# Patient Record
Sex: Male | Born: 1990 | Race: White | Hispanic: No | Marital: Single | Smoking: Current every day smoker
Health system: Southern US, Community
[De-identification: ages and names within clinical notes are randomized; demographics above are authoritative.]

## PROBLEM LIST (undated history)

## (undated) DIAGNOSIS — I10 Essential (primary) hypertension: Secondary | ICD-10-CM

## (undated) HISTORY — PX: OTHER SURGICAL HISTORY: SHX169

---
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
## 2010-08-23 LAB — URINALYSIS, ROUTINE W REFLEX MICROSCOPIC
Bilirubin Urine: NEGATIVE
Glucose, UA: NEGATIVE mg/dL
Hgb urine dipstick: NEGATIVE
Ketones, ur: NEGATIVE mg/dL
Protein, ur: NEGATIVE mg/dL
Specific Gravity, Urine: >1.03 — ABNORMAL HIGH (ref 1.005–1.030)

## 2010-08-23 LAB — DIFFERENTIAL
Basophils Absolute: 0 10*3/uL (ref 0.0–0.1)
Basophils Relative: 0 % (ref 0–1)
Eosinophils Absolute: 0 10*3/uL (ref 0.0–0.7)
Lymphocytes Relative: 14 % (ref 12–46)
Lymphs Abs: 1.4 10*3/uL (ref 0.7–4.0)
Monocytes Absolute: 0.7 10*3/uL (ref 0.1–1.0)
Monocytes Relative: 7 % (ref 3–12)

## 2010-08-23 LAB — CBC
RBC: 5.27 MIL/uL (ref 4.22–5.81)
RDW: 12.8 % (ref 11.5–15.5)
WBC: 9.8 10*3/uL (ref 4.0–10.5)

## 2010-08-23 LAB — BASIC METABOLIC PANEL
BUN: 10 mg/dL (ref 6–23)
CO2: 27 mEq/L (ref 19–32)
Calcium: 9.4 mg/dL (ref 8.4–10.5)
Chloride: 103 mEq/L (ref 96–112)
Creatinine, Ser: 1.04 mg/dL (ref 0.4–1.5)
GFR calc Af Amer: >60 mL/min (ref >60)
GFR calc non Af Amer: >60 mL/min (ref >60)
Glucose, Bld: 96 mg/dL (ref 70–99)
Potassium: 3.8 mEq/L (ref 3.5–5.1)
Sodium: 140 mEq/L (ref 135–145)

## 2010-09-23 NOTE — Op Note (Signed)
NAMEKAMDEN, STANISLAW NO.:  [id number]   MEDICAL RECORD NO.:  [id number]                   PATIENT TYPE:  INP   LOCATION:  6148                                 FACILITY:  [facility]   PHYSICIAN:  Ines Bloomer, M.D.              DATE OF BIRTH:  Mar 06, 1991   DATE OF PROCEDURE:  10/09/2003  DATE OF DISCHARGE:                                 OPERATIVE REPORT   PREOPERATIVE DIAGNOSIS:  Pectus carinatum.   POSTOPERATIVE DIAGNOSIS:  Pectus carinatum.   OPERATION PERFORMED:  Repair of pectus carinatum.   SURGEON:  Ines Bloomer, M.D.   FIRST ASSISTANT:  Coral Ceo, P.A.   General anesthesia.   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   After the patient was prepped and draped in the usual sterile manner and  placed in the supine position, a transverse incision was made over the fifth  intercostal space, crossing the midline with it evenly symmetrical on both  the right and left side of the midline.  The skin flaps were then developed  superiorly and inferiorly.  Superiorly they were developed up to the angle  of Louis or the second intercostal space cartilage, and inferiorly they were  developed 4-5 cm down to the rectus muscle and laterally developing the  rectus muscle and serratus muscle.  The pectoralis major muscle was divided  in the midline off the sternum and dissected up medially and laterally,  developing flaps to expose the costal cartilages from two down to the costal  margin bilaterally.  Inferiorly the xiphoid was detached from the rectus  muscle and the rectus muscle was also detached from the costal margin with  electrocautery and attention was started on the left side, and a  subperichondrial resection was done, stripping the anterior portion of the  perichondrium with electrocautery, then using jokers and small periosteal  elevators to strip the  perichondrium off the cartilage from the insertion  into the sternum out to the costochondral junction.  After this had been  dissected out first starting with the fourth costal cartilage, it was  resected and removed to allow for regeneration of the cartilage.  In a like  manner, the third was done and then the fourth and the fifth, sixth, and  seventh costal cartilages were removed.  The fifth, sixth, and seventh  costal cartilages were markedly deformed as the pectus carinatum was more  elevated to the left than to the right.  After these had been resected  subperiosteally, the third and the fourth back to the costochondral junction  and the third, fourth, and fifth both to the costochondral junction, but the  sixth and the seventh were dissected back to the anterior axillary line and  then removed.  After all of this had been done on the left side, all  bleeders were electrocoagulated and attention was  turned to the right side,  and in like manner the fourth costal cartilage was resected subperiosteally,  again using periosteal elevators to free up the perichondrium and then  dividing the cartilage and dissecting it out proximally and distally and  removing it at the sternochondral junction, all cartilage at the  sternochondral junction.  The third, fourth, and fifth costal cartilages  were removed in this manner.  The sixth and seventh cartilages, which were  not as deformed as on the left side, were also removed back to the anterior  axillary line subperichondrially.  After this had been done, all bleeders  were again electrocoagulated and then the sternum was detached from the  intercostal muscle bundles with electrocautery and freed up posteriorly from  the areolar tissue posterior to the sternum.  The sternum was completely  freed up all the way up to the second costal cartilage, which was right at  the angle of Louis.  After this had been done, two double osteotomies were  done,  one right at the angle of Louis and one about 3 cm distal down the  angle of Louis.  These were anterior osteotomies down to the posterior  cortex that allowed the sternum to be straightened out from its previous  position, elevated position.  The sternum was straightened and the  intercostal muscle bundles were reattached to the sternum with interrupted 2-  0 Vicryl.  The xiphoid had been removed but to the distal end of the  sternum, the rectus muscle was reattached with 2-0 Vicryl.  Then the  pectoralis muscle was closed with 2-0 Vicryl over the sternum and the  pectoralis and the rectus muscle was reattached inferiorly to the pectoralis  muscle.  Underneath this muscle layer a Blake drain was placed on the right  side and sutured in place with 3-0 silk.  Then above the muscle layer I left  a Blake drain, a #10 Blake drain was placed in the left side and sutured in  place with 2-0 silk.  They were brought in through separate stab wounds.  Then the skin flap was closed with interrupted 2-0 Vicryl to tack the flaps  down to the muscle layer underneath and the skin was closed with, the  subcutaneous tissue was closed with 3-0 Vicryl and the skin with 4-0  Monocryl, Dermabond also for the skin.  A dry and sterile dressing applied  and the patient was returned to the recovery room in stable condition.                                               Ines Bloomer, M.D.    DPB/MEDQ  D:  10/09/2003  T:  10/10/2003  Job:  [id number]

## 2010-09-23 NOTE — H&P (Signed)
Stephen Villa, TOWELL NO.:  [id number]   MEDICAL RECORD NO.:  [id number]                   PATIENT TYPE:  INP   LOCATION:  NA                                   FACILITY:  [facility]   PHYSICIAN:  Ines Bloomer, M.D.              DATE OF BIRTH:  09-07-1990   DATE OF ADMISSION:  10/09/2003  DATE OF DISCHARGE:                                HISTORY & PHYSICAL   REFERRING PHYSICIAN:  Dr. Laverle Hobby of [facility] in [place].   CHIEF COMPLAINT:  Chest wall deformity first noticed approximately 1 year  ago.   HISTORY OF PRESENT ILLNESS:  Stephen Villa is a 70 year old Caucasian  male who was referred to Dr. Ines Bloomer by Dr. Dr. Wende Crease for  evaluation for a painful protrusion in his left lower sternum that has  increased in nature as he has been increasing in size.  A chest CT was done  showing a pectus carinatum configuration of the chest.  No masses were seen  and the skin was otherwise normal.  Since his chest protrusion was tender at  times and quite noticeable, he was referred to Dr. Edwyna Shell for possible  repair.  He was seen by Dr. Edwyna Shell in late December of 2004.  At that time,  Dr. Edwyna Shell recommended surgical repair.  The family has elected to schedule  the repair once the patient was on summer vacation from school.  Since  December, [name] says his chest protrusion has grown in size.  It does not  cause him any respiratory problems.  He also denies chest pain.  The  protrusion is not necessarily tender to touch but is painful at times,  particularly when playing sports.  Of note, [name] was recently treated for  chickenpox by his primary physician.  He was first diagnosed on Sep 28, 2003  and is status post 1-week treatment of Valtrex and hydroxyzine as needed.  The patient says he is recovering well.  He is afebrile and his lesions are  now drying and beginning to resolve.   PAST MEDICAL HISTORY:  Recently treated for varicella  zoster.  Otherwise,  his past medical history is essentially negative other than a short  hospitalization as an infant for a gastrointestinal illness.   PAST SURGICAL HISTORY:  He has had no prior surgeries.   ALLERGIES:  He has no known drug allergies.   MEDICATIONS:  He is currently on no medications.   SOCIAL HISTORY:  Stephen Villa will be going into the eighth grade this fall.  He  lives with his mom and sister in [place].  He denies any use of alcohol  or tobacco products.   FAMILY HISTORY:  Family history noncontributory.  Negative for hypertension,  cancer, coronary artery disease, vascular disease or chest abnormalities.  His grandmother does have diabetes mellitus.   REVIEW OF SYSTEMS:  Please refer to history of present illness for pertinent  positive and negatives.  In addition, he reports no changes in his weight,  no lower extremity edema, and no fever.   PHYSICAL EXAM:  VITAL SIGNS:  His blood pressure is 126/80, heart rate 80,  respirations 20.  GENERAL APPEARANCE:  This is a 20 year old Caucasian male who is alert,  cooperative, in no acute distress.  He does have multiple dried  maculopapular lesions scattered over his body, particularly noted on his  trunk, face and neck.  There is no drainage or acute erythema noted.  These  lesions do appear consistent with resolving varicella zoster.  HEENT:  His head is normocephalic and atraumatic.  His pupils are equal and  his sclerae are nonicteric.  His oral mucous membranes are pink and moist.  He has good dentition with no loose teeth noted.  No erythema or lesions are  noted.  NECK:  His neck is supple.  No thyromegaly, lymphadenopathy or jugular  venous distention is noted.  RESPIRATORY:  His breathing is unlabored and his breath sounds are clear  throughout.  No wheezes, rhonchi or rales are noted.  He does have a  significant protrusion of his lower sternum with elevation of the sternum to  the left.  No  excoriations or erosions are noted.  His chest protrusion is  nontender and is without erythema.  CARDIAC:  His heart has a regular rate and rhythm.  No murmur, rub or gallop  are noted.  ABDOMEN:  His abdomen is soft, nontender and nondistended.  He does have  normoactive bowel sounds.  No hepatosplenomegaly or masses are noted.  GU/RECTAL:  These exams were deferred.  EXTREMITIES:  His extremities are warm.  They are without edema.  He has  palpable distal pulses.  NEUROLOGIC:  His neurologic exam is grossly intact.  He is alert and  oriented x4.  His gait is steady and his speech is clear.  His muscle  strength in his bilateral upper and lower extremities is 5/5.   IMPRESSION:  1. Pectus carinatum.  2. Recovering from recent varicella zoster, status post 1-week treatment     with Valtrex.   PLAN:  I have discussed Mr. Monahan's recent varicella zoster infection  with Dr. Edwyna Shell.  Dr. Edwyna Shell felt that since [name] is status post treatment  with Valtrex and the fact that his lesions are now drying and starting to  clear, that it would be okay to proceed with repair of his pectus carinatum  as planned on October 09, 2003 at [facility].  Surgery will be  performed by Dr. Edwyna Shell, who has seen and examined the patient prior to his  hospitalization and has explained the risks, benefits and alternatives with  both the patient and his mother.  The patient, with the consent of his  mother, has agreed to proceed.      [name], P.A.                  Ines Bloomer, M.D.    AWZ/MEDQ  D:  10/07/2003  T:  10/07/2003  Job:  [id number]   cc:   Ines Bloomer, M.D.  [address]  [place]  [place] [address]   Laverle Hobby, M.D.

## 2010-09-23 NOTE — Discharge Summary (Signed)
Stephen Villa, Stephen Villa NO.:  [id number]   MEDICAL RECORD NO.:  [id number]                   PATIENT TYPE:  INP   LOCATION:  6148                                 FACILITY:  [facility]   PHYSICIAN:  Ines Bloomer, M.D.              DATE OF BIRTH:  12/23/1990   DATE OF ADMISSION:  10/09/2003  DATE OF DISCHARGE:  10/13/2003                                 DISCHARGE SUMMARY   HISTORY OF PRESENT ILLNESS:  This is a 20 year old male who was admitted in  referral from Dr. Wende Crease at Urgent Care in [place] due to painful  protrusion of his left lower sternum that has increased in nature as he has  increased in size.  Chest CT revealed a pectus carinatum configuration of  the chest and no mass was seen, and the scan was otherwise normal.  Since  the protrusion was felt to be tender and quite noticeable, he was referred  to Dr. Edwyna Shell for possible repair.  Dr. Edwyna Shell evaluated the patient and  recommended surgical repair, and he was admitted as hospitalization for the  procedure.   PAST MEDICAL HISTORY:  No significant.  He did have recent chicken pox.   ALLERGIES:  No known drug allergies.   MEDICATIONS ON ADMISSION:  None.   PAST SURGICAL HISTORY:  None.   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  He lives with his mom and one sister in [place],  [place].   FAMILY HISTORY:  Noncontributory.   REVIEW OF SYSTEMS:  Unremarkable.   PHYSICAL EXAMINATION:  Please see the history and physical done at the time  of admission.   HOSPITAL COURSE:  The patient was admitted electively and on 10/09/2003 was  taken to the operating room where he underwent a repair of pectus carinatum  by Dr. Edwyna Shell.  The patient tolerated the procedure well and was taken to  the post anesthesia care unit in stable condition.   POSTOPERATIVE HOSPITAL COURSE:  Hospital course has been unremarkable.  Laboratory values are  stable.  He did have a slight initial bump in his AST  and ALT but they have returned to normal.  Hemoglobin and hematocrit on  postoperative day number 1 were stable at 14.2 and 40.8 respectively.  His  routine lines, monitors and all drainage devices have been discontinued in  the standard fashion without difficulty.  His incision is healing well.  He  has tolerated a gradual increase in diet without difficulty.  He has  tolerated a gradual increase in activity, commensurate for level of  postoperative convalescence.  Overall, he is felt to be tentatively stable  for discharge in the morning of October 13, 2003 pending morning round re-  evaluation.   FINAL DIAGNOSIS:  Pectus carinatum.   INSTRUCTIONS:  The patient received written instructions in regard to  medications, activity, diet,  wound care and followup.  Medications for pain  will be Tylox 1 every 4 to 6 hours as needed.  Followup will be Dr. Edwyna Shell  on June 14 at 4:15 p.m. with a chest x-ray for [facility].      Rowe Clack, P.A.-C.                    Ines Bloomer, M.D.    Sherryll Burger  D:  10/12/2003  T:  10/13/2003  Job:  [id number]   cc:   Ines Bloomer, M.D.  [address]  [place]  [place] [address]

## 2011-02-16 LAB — DIFFERENTIAL
Eosinophils Relative: 2
Lymphocytes Relative: 32
Lymphs Abs: 1.6
Monocytes Absolute: 0.5
Monocytes Relative: 9

## 2011-02-16 LAB — BASIC METABOLIC PANEL
CO2: 29
Calcium: 9.2
Sodium: 139

## 2011-02-16 LAB — CBC
HCT: 45.5
Hemoglobin: 15.7
MCV: 88.8
RBC: 5.12
WBC: 5

## 2011-02-16 LAB — TROPONIN I: Troponin I: 0.03

## 2011-02-16 LAB — CK TOTAL AND CKMB (NOT AT ARMC): CK, MB: 3.5

## 2012-01-07 ENCOUNTER — Emergency Department (HOSPITAL_COMMUNITY)
Admission: EM | Admit: 2012-01-07 | Discharge: 2012-01-08 | Disposition: A | Discharge status: Home / Self Care | Payer: Self-pay | Attending: Emergency Medicine | Admitting: Emergency Medicine

## 2012-01-07 ENCOUNTER — Encounter (HOSPITAL_COMMUNITY): Payer: Self-pay | Admitting: Emergency Medicine

## 2012-01-07 DIAGNOSIS — F17200 Nicotine dependence, unspecified, uncomplicated: Secondary | ICD-10-CM | POA: Insufficient documentation

## 2012-01-07 DIAGNOSIS — I10 Essential (primary) hypertension: Secondary | ICD-10-CM | POA: Insufficient documentation

## 2012-01-07 DIAGNOSIS — R55 Syncope and collapse: Secondary | ICD-10-CM

## 2012-01-07 DIAGNOSIS — R040 Epistaxis: Secondary | ICD-10-CM

## 2012-01-07 LAB — CBC WITH DIFFERENTIAL/PLATELET
Hemoglobin: 15.6 g/dL (ref 13.0–17.0)
Lymphocytes Relative: 28 % (ref 12–46)
MCV: 91.7 fL (ref 78.0–100.0)
Monocytes Absolute: 0.7 10*3/uL (ref 0.1–1.0)
Monocytes Relative: 8 % (ref 3–12)
Neutrophils Relative %: 62 % (ref 43–77)

## 2012-01-07 MED ORDER — SODIUM CHLORIDE 0.9 % IV SOLN
Freq: Once | INTRAVENOUS | Status: AC
  Administered 2012-01-07: via INTRAVENOUS

## 2012-01-07 NOTE — ED Notes (Addendum)
Per EMS - patient has been outside all day and not drinking any liquids. States he had a syncopal episode and nose bleed. Also complaining of hypertension 160/100. No bleeding noted at triage. Patient alert and oriented at triage.

## 2012-01-07 NOTE — ED Provider Notes (Signed)
History     CSN: [id number]  Arrival date & time 01/07/12  2145   First MD Initiated Contact with Patient 01/07/12 2309      Chief Complaint  Patient presents with  . Heat Exposure  . Loss of Consciousness  . Hypertension    (Consider location/radiation/quality/duration/timing/severity/associated sxs/prior treatment) HPI Comments: Pt states he has been working at a [employer] for the past 4 days and standing frequently in the direct sun.  He has not been overly exertional and has been drinking plenty of fluids.  When he got home this PM he "didn't feel good".  He went into the bathroom and was washing his hands and noticed his L nostril was bleeding and reportedly passed out although he denies it was seeing blood.  Family state that he did not respond to them calling his name ~ 10 min.  He was brought to the ED via EMS.  They started an IV and was given 500 ml of NS.  Feels "normal" at exam time.  Patient is a 21 y.o. male presenting with syncope and hypertension. The history is provided by the patient. No language interpreter was used.  Loss of Consciousness Episode onset: severa hours ago. The problem has been resolved. Pertinent negatives include no chills, fever, nausea or vomiting. Nothing aggravates the symptoms. He has tried nothing for the symptoms.  Hypertension Pertinent negatives include no chills, fever, nausea or vomiting.    History reviewed. No pertinent past medical history.  Past Surgical History  Procedure Date  . Chest cartilage surgery     History reviewed. No pertinent family history.  History  Substance Use Topics  . Smoking status: Current Everyday Smoker  . Smokeless tobacco: Not on file  . Alcohol Use: No      Review of Systems  Constitutional: Negative for fever and chills.  HENT: Positive for nosebleeds.   Cardiovascular: Positive for syncope.  Gastrointestinal: Negative for nausea and vomiting.  Neurological: Positive for syncope.  All  other systems reviewed and are negative.    Allergies  Atenolol  Home Medications  No current outpatient prescriptions on file.  BP 138/74  Pulse 105  Temp 98.3 F (36.8 C) (Oral)  Resp 16  Ht 6\' 1"  (1.854 m)  Wt 220 lb (99.791 kg)  BMI 29.03 kg/m2  SpO2 98%  Physical Exam  Nursing note and vitals reviewed. Constitutional: He is oriented to person, place, and time. He appears well-developed and well-nourished.  HENT:  Head: Normocephalic and atraumatic.  Nose: Nose normal.       No active bleeding from nose   Eyes: EOM are normal.  Neck: Normal range of motion.  Cardiovascular: Normal rate, regular rhythm, normal heart sounds and intact distal pulses.   Pulmonary/Chest: Effort normal and breath sounds normal. No respiratory distress.  Abdominal: Soft. He exhibits no distension. There is no tenderness.  Musculoskeletal: Normal range of motion.  Neurological: He is alert and oriented to person, place, and time.  Skin: Skin is warm and dry.  Psychiatric: He has a normal mood and affect. Judgment normal.    ED Course  Procedures (including critical care time)   Labs Reviewed  BASIC METABOLIC PANEL  URINALYSIS, ROUTINE W REFLEX MICROSCOPIC  CBC WITH DIFFERENTIAL   No results found.   No diagnosis found.  0030-RN called me and states the pt's labs are back.  The pt has rec'd 500 ml of the 1000 ml ordered but he feels better and wants to go home.  MDM  Return as needed. Drink plenty of fluids.         Evalina Field, [place] 01/08/12 [phone]

## 2012-01-08 LAB — URINALYSIS, ROUTINE W REFLEX MICROSCOPIC
Glucose, UA: NEGATIVE mg/dL
Hgb urine dipstick: NEGATIVE
Ketones, ur: NEGATIVE mg/dL
Nitrite: NEGATIVE
Protein, ur: NEGATIVE mg/dL
Urobilinogen, UA: 0.2 mg/dL (ref 0.0–1.0)

## 2012-01-08 LAB — BASIC METABOLIC PANEL
BUN: 12 mg/dL (ref 6–23)
Creatinine, Ser: 1.26 mg/dL (ref 0.50–1.35)
GFR calc Af Amer: >90 mL/min (ref >90)
Potassium: 4 mEq/L (ref 3.5–5.1)

## 2012-01-08 NOTE — ED Provider Notes (Signed)
Medical screening examination/treatment/procedure(s) were performed by non-physician practitioner and as supervising physician I was immediately available for consultation/collaboration.    Vida Roller, MD 01/08/12 [phone]

## 2012-01-08 NOTE — ED Notes (Signed)
Patient requesting to leave. States he feels better and does not feel like he needs the rest of the IV fluid. Advised Al Decant PA.

## 2012-01-27 ENCOUNTER — Emergency Department (HOSPITAL_COMMUNITY)
Admission: EM | Admit: 2012-01-27 | Discharge: 2012-01-27 | Disposition: A | Discharge status: Home / Self Care | Payer: Self-pay | Attending: Emergency Medicine | Admitting: Emergency Medicine

## 2012-01-27 ENCOUNTER — Encounter (HOSPITAL_COMMUNITY): Payer: Self-pay | Admitting: *Deleted

## 2012-01-27 DIAGNOSIS — F17200 Nicotine dependence, unspecified, uncomplicated: Secondary | ICD-10-CM | POA: Insufficient documentation

## 2012-01-27 DIAGNOSIS — L731 Pseudofolliculitis barbae: Secondary | ICD-10-CM

## 2012-01-27 DIAGNOSIS — L738 Other specified follicular disorders: Secondary | ICD-10-CM | POA: Insufficient documentation

## 2012-01-27 DIAGNOSIS — Z888 Allergy status to other drugs, medicaments and biological substances status: Secondary | ICD-10-CM | POA: Insufficient documentation

## 2012-01-27 MED ORDER — SULFAMETHOXAZOLE-TRIMETHOPRIM 800-160 MG PO TABS: 1.0000 | ORAL_TABLET | Freq: Two times a day (BID) | ORAL | Status: DC

## 2012-01-27 NOTE — ED Notes (Signed)
Pt c/o lump to left testicle area for several months, denies any drainage.

## 2012-01-27 NOTE — ED Provider Notes (Signed)
History     CSN: [id number]  Arrival date & time 01/27/12  1807   First MD Initiated Contact with Patient 01/27/12 1816      Chief Complaint  Patient presents with  . Mass    (Consider location/radiation/quality/duration/timing/severity/associated sxs/prior treatment) HPI Comments: The patient presents with a two month history of a swollen area to the left groin.  It is not painful unless he squeezes it or attempts to drain it with a pin.  There is no trouble with urination.  No fevers or chills.  He read on internet about various things it could be a got scared.    The history is provided by the patient.    History reviewed. No pertinent past medical history.  Past Surgical History  Procedure Date  . Chest cartilage surgery     No family history on file.  History  Substance Use Topics  . Smoking status: Current Every Day Smoker  . Smokeless tobacco: Not on file  . Alcohol Use: No      Review of Systems  All other systems reviewed and are negative.    Allergies  Atenolol  Home Medications  No current outpatient prescriptions on file.  BP 138/84  Pulse 90  Temp 98.2 F (36.8 C)  Resp 20  Ht 6\' 1"  (1.854 m)  Wt 220 lb (99.791 kg)  BMI 29.03 kg/m2  SpO2 98%  Physical Exam  Nursing note and vitals reviewed. Constitutional: He is oriented to person, place, and time. He appears well-developed and well-nourished. No distress.  HENT:  Head: Normocephalic and atraumatic.  Neck: Normal range of motion. Neck supple.  Musculoskeletal: Normal range of motion.  Neurological: He is alert and oriented to person, place, and time.  Skin: He is not diaphoretic.       In the left groin there is a small pea-sized lump on the skin that appears to be a hair follicle.  There is no fluctuance and no surrounding erythema.  There is no drainage.    ED Course  Procedures (including critical care time)  Labs Reviewed - No data to display No results found.   No  diagnosis found.    MDM  Does not seem to be an abscess.  At this point I will not attempt to I and D.  Will treat with soaks, antibiotics.  Return prn.        Geoffery Lyons, MD 01/27/12 [phone]

## 2012-02-01 ENCOUNTER — Emergency Department (HOSPITAL_COMMUNITY): Payer: Self-pay

## 2012-02-01 ENCOUNTER — Encounter (HOSPITAL_COMMUNITY): Payer: Self-pay

## 2012-02-01 ENCOUNTER — Emergency Department (HOSPITAL_COMMUNITY)
Admission: EM | Admit: 2012-02-01 | Discharge: 2012-02-01 | Disposition: A | Discharge status: Home / Self Care | Payer: Self-pay | Attending: Emergency Medicine | Admitting: Emergency Medicine

## 2012-02-01 DIAGNOSIS — S29011A Strain of muscle and tendon of front wall of thorax, initial encounter: Secondary | ICD-10-CM

## 2012-02-01 DIAGNOSIS — X500XXA Overexertion from strenuous movement or load, initial encounter: Secondary | ICD-10-CM | POA: Insufficient documentation

## 2012-02-01 DIAGNOSIS — IMO0002 Reserved for concepts with insufficient information to code with codable children: Secondary | ICD-10-CM | POA: Insufficient documentation

## 2012-02-01 DIAGNOSIS — F17200 Nicotine dependence, unspecified, uncomplicated: Secondary | ICD-10-CM | POA: Insufficient documentation

## 2012-02-01 NOTE — ED Notes (Signed)
Patient with no complaints at this time. Respirations even and unlabored. Skin warm/dry. Discharge instructions reviewed with patient at this time. Patient given opportunity to voice concerns/ask questions.e. Patient discharged at this time and left Emergency Department with steady gait.   

## 2012-02-01 NOTE — ED Notes (Signed)
Was lifting on Tuesday afternoon and felt something pull in my chest. Had a prior surgery  In 2006 on my sternum and that is where it hurts per pt.
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
## 2012-02-03 NOTE — ED Provider Notes (Signed)
Medical screening examination/treatment/procedure(s) were performed by non-physician practitioner and as supervising physician I was immediately available for consultation/collaboration.  Flint Melter, MD 02/03/12 1031

## 2012-04-28 ENCOUNTER — Emergency Department (HOSPITAL_COMMUNITY): Payer: No Typology Code available for payment source

## 2012-04-28 ENCOUNTER — Encounter (HOSPITAL_COMMUNITY): Payer: Self-pay | Admitting: Emergency Medicine

## 2012-04-28 ENCOUNTER — Emergency Department (HOSPITAL_COMMUNITY)
Admission: EM | Admit: 2012-04-28 | Discharge: 2012-04-28 | Disposition: A | Discharge status: Home / Self Care | Payer: No Typology Code available for payment source | Attending: Emergency Medicine | Admitting: Emergency Medicine

## 2012-04-28 DIAGNOSIS — F17200 Nicotine dependence, unspecified, uncomplicated: Secondary | ICD-10-CM | POA: Insufficient documentation

## 2012-04-28 DIAGNOSIS — Z9889 Other specified postprocedural states: Secondary | ICD-10-CM | POA: Insufficient documentation

## 2012-04-28 DIAGNOSIS — S8002XA Contusion of left knee, initial encounter: Secondary | ICD-10-CM

## 2012-04-28 DIAGNOSIS — Y92410 Unspecified street and highway as the place of occurrence of the external cause: Secondary | ICD-10-CM | POA: Insufficient documentation

## 2012-04-28 DIAGNOSIS — Y9389 Activity, other specified: Secondary | ICD-10-CM | POA: Insufficient documentation

## 2012-04-28 DIAGNOSIS — I10 Essential (primary) hypertension: Secondary | ICD-10-CM | POA: Insufficient documentation

## 2012-04-28 DIAGNOSIS — S8000XA Contusion of unspecified knee, initial encounter: Secondary | ICD-10-CM | POA: Insufficient documentation

## 2012-04-28 HISTORY — DX: Essential (primary) hypertension: I10
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
## 2012-05-02 NOTE — ED Provider Notes (Signed)
Medical screening examination/treatment/procedure(s) were performed by non-physician practitioner and as supervising physician I was immediately available for consultation/collaboration.   Carleene Cooper III, MD 05/02/12 [phone]

## 2012-06-23 ENCOUNTER — Emergency Department: Payer: Self-pay | Admitting: Emergency Medicine

## 2012-10-22 ENCOUNTER — Emergency Department: Payer: Self-pay | Admitting: Emergency Medicine

## 2012-10-22 LAB — URINALYSIS, COMPLETE
Bilirubin,UR: NEGATIVE
Blood: NEGATIVE
Leukocyte Esterase: NEGATIVE
Nitrite: NEGATIVE
Ph: 6 (ref 4.5–8.0)
RBC,UR: 2 /HPF (ref 0–5)
Squamous Epithelial: NONE SEEN
WBC UR: 5 /HPF (ref 0–5)

## 2012-10-22 LAB — CBC
HGB: 15.9 g/dL (ref 13.0–18.0)
MCHC: 34.6 g/dL (ref 32.0–36.0)
MCV: 92 fL (ref 80–100)
RBC: 4.98 10*6/uL (ref 4.40–5.90)

## 2012-10-22 LAB — COMPREHENSIVE METABOLIC PANEL
Albumin: 4 g/dL (ref 3.4–5.0)
BUN: 12 mg/dL (ref 7–18)
Bilirubin,Total: 0.4 mg/dL (ref 0.2–1.0)
Calcium, Total: 9 mg/dL (ref 8.5–10.1)
Co2: 28 mmol/L (ref 21–32)
EGFR (African American): >60
Glucose: 93 mg/dL (ref 65–99)
SGOT(AST): 27 U/L (ref 15–37)
Sodium: 139 mmol/L (ref 136–145)

## 2012-12-25 ENCOUNTER — Emergency Department: Payer: Self-pay | Admitting: Emergency Medicine

## 2013-01-14 ENCOUNTER — Emergency Department: Payer: Self-pay | Admitting: Emergency Medicine

## 2013-09-21 ENCOUNTER — Emergency Department: Payer: Self-pay | Admitting: Emergency Medicine

## 2013-09-21 LAB — CBC
HCT: 45.9 % (ref 40.0–52.0)
HGB: 15.4 g/dL (ref 13.0–18.0)
MCH: 31.3 pg (ref 26.0–34.0)
MCHC: 33.6 g/dL (ref 32.0–36.0)
MCV: 93 fL (ref 80–100)
Platelet: 199 10*3/uL (ref 150–440)
RBC: 4.92 10*6/uL (ref 4.40–5.90)
RDW: 13.5 % (ref 11.5–14.5)
WBC: 12.2 10*3/uL — AB (ref 3.8–10.6)

## 2013-09-21 LAB — ETHANOL

## 2013-09-21 LAB — BASIC METABOLIC PANEL
ANION GAP: 3 — AB (ref 7–16)
BUN: 15 mg/dL (ref 7–18)
CHLORIDE: 110 mmol/L — AB (ref 98–107)
Calcium, Total: 8.6 mg/dL (ref 8.5–10.1)
Co2: 29 mmol/L (ref 21–32)
Creatinine: 1.46 mg/dL — ABNORMAL HIGH (ref 0.60–1.30)
EGFR (African American): >60
EGFR (Non-African Amer.): >60
GLUCOSE: 101 mg/dL — AB (ref 65–99)
Osmolality: 284 (ref 275–301)
POTASSIUM: 4.1 mmol/L (ref 3.5–5.1)
Sodium: 142 mmol/L (ref 136–145)

## 2013-09-22 LAB — DRUG SCREEN, URINE

## 2013-09-22 LAB — URINALYSIS, COMPLETE
BACTERIA: NONE SEEN
Bilirubin,UR: NEGATIVE
Blood: NEGATIVE
GLUCOSE, UR: NEGATIVE mg/dL (ref 0–75)
Ketone: NEGATIVE
LEUKOCYTE ESTERASE: NEGATIVE
NITRITE: NEGATIVE
PH: 6 (ref 4.5–8.0)
Protein: NEGATIVE
RBC,UR: 2 /HPF (ref 0–5)
Specific Gravity: 1.05 (ref 1.003–1.030)
Squamous Epithelial: <1
WBC UR: 2 /HPF (ref 0–5)

## 2014-03-06 ENCOUNTER — Emergency Department (HOSPITAL_COMMUNITY)
Admission: EM | Admit: 2014-03-06 | Discharge: 2014-03-06 | Disposition: A | Discharge status: Home / Self Care | Payer: 59 | Attending: Emergency Medicine | Admitting: Emergency Medicine

## 2014-03-06 ENCOUNTER — Encounter (HOSPITAL_COMMUNITY): Payer: Self-pay | Admitting: Emergency Medicine

## 2014-03-06 ENCOUNTER — Emergency Department (HOSPITAL_COMMUNITY): Payer: 59

## 2014-03-06 DIAGNOSIS — Z720 Tobacco use: Secondary | ICD-10-CM | POA: Insufficient documentation

## 2014-03-06 DIAGNOSIS — M25561 Pain in right knee: Secondary | ICD-10-CM | POA: Insufficient documentation

## 2014-03-06 DIAGNOSIS — I10 Essential (primary) hypertension: Secondary | ICD-10-CM | POA: Diagnosis not present

## 2014-03-06 DIAGNOSIS — R609 Edema, unspecified: Secondary | ICD-10-CM

## 2014-03-06 MED ORDER — TRAMADOL HCL 50 MG PO TABS: 50.0000 mg | ORAL_TABLET | Freq: Four times a day (QID) | ORAL | Status: DC | PRN

## 2014-03-06 NOTE — ED Provider Notes (Signed)
CSN: [id number]     Arrival date & time 03/06/14  0714 History   First MD Initiated Contact with Patient 03/06/14 [phone]     Chief Complaint  Patient presents with  . Knee Pain     (Consider location/radiation/quality/duration/timing/severity/associated sxs/prior Treatment) HPI Comments: Pt comes in today with complaint of right knee pain times 1 month. States that he has no known injury. States that she has been seen at multiple urgent cares without relief. 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 Has tried otc medication without much relief   The history is provided by the patient. No language interpreter was used.    Past Medical History  Diagnosis Date  . Hypertension    Past Surgical History  Procedure Laterality Date  . Chest cartilage surgery     History reviewed. No pertinent family history. History  Substance Use Topics  . Smoking status: Current Every Day Smoker -- 0.50 packs/day  . Smokeless tobacco: Not on file  . Alcohol Use: No    Review of Systems  All other systems reviewed and are negative.     Allergies  Atenolol  Home Medications   Prior to Admission medications   Not on File   BP 139/83  Pulse 94  Temp(Src) 97.8 F (36.6 C) (Oral)  Resp 18  Ht 6\' 1"  (1.854 m)  Wt 220 lb (99.791 kg)  BMI 29.03 kg/m2  SpO2 100% Physical Exam  Nursing note and vitals reviewed. Constitutional: He is oriented to person, place, and time. He appears well-developed and well-nourished.  Cardiovascular: Normal rate and regular rhythm.   Pulmonary/Chest: Effort normal and breath sounds normal.  Musculoskeletal:  Full rom of right knee without redness, swelling or warmth.   Neurological: He is alert and oriented to person, place, and time.  Skin: Skin is warm and dry.    ED Course  Procedures (including critical care time) Labs Review Labs Reviewed - No data to display  Imaging Review Dg Knee Complete 4 Views  Right  03/06/2014   CLINICAL DATA:  23 year old male with 3-4 week history of right knee pain and swelling. If pain localizes medial aspect of the knee. No known injury.  EXAM: RIGHT KNEE - COMPLETE 4+ VIEW  COMPARISON:  None.  FINDINGS: There is no evidence of fracture, dislocation, or joint effusion. There is no evidence of arthropathy or other focal bone abnormality. Soft tissues are unremarkable.  IMPRESSION: Negative.   Electronically Signed   By: Malachy MoanHeath  McCullough M.D.   On: 03/06/2014 08:52     EKG Interpretation None      MDM   Final diagnoses:  Swelling  Right knee pain    No bony abnormality noted. Will given ortho follow up.will give tramadol for pain. No concerns for a septic joint    Teressa LowerVrinda Maycen Degregory, NP 03/06/14 1349

## 2014-03-06 NOTE — Discharge Instructions (Signed)

## 2014-03-06 NOTE — ED Notes (Signed)
Pt reports right knee pain x1 month. Pt reports has been seen for same at several urgent cares with no relief or diagnosis. Pt reports "its starting to swell lately." no obvious deformity noted. Pt able to bear weight. nad noted.

## 2014-03-07 NOTE — ED Provider Notes (Signed)
Medical screening examination/treatment/procedure(s) were performed by non-physician practitioner and as supervising physician I was immediately available for consultation/collaboration.   EKG Interpretation None       Seichi Kaufhold, MD 03/07/14 1043 

## 2014-03-27 ENCOUNTER — Ambulatory Visit: Payer: Self-pay | Admitting: Physician Assistant

## 2014-04-19 ENCOUNTER — Emergency Department: Payer: Self-pay | Admitting: Emergency Medicine

## 2015-05-30 ENCOUNTER — Emergency Department (HOSPITAL_COMMUNITY)
Admission: EM | Admit: 2015-05-30 | Discharge: 2015-05-30 | Disposition: A | Discharge status: Home / Self Care | Payer: Self-pay | Attending: Emergency Medicine | Admitting: Emergency Medicine

## 2015-05-30 ENCOUNTER — Encounter (HOSPITAL_COMMUNITY): Payer: Self-pay | Admitting: Emergency Medicine

## 2015-05-30 DIAGNOSIS — Y9389 Activity, other specified: Secondary | ICD-10-CM | POA: Insufficient documentation

## 2015-05-30 DIAGNOSIS — I10 Essential (primary) hypertension: Secondary | ICD-10-CM | POA: Insufficient documentation

## 2015-05-30 DIAGNOSIS — X58XXXA Exposure to other specified factors, initial encounter: Secondary | ICD-10-CM | POA: Insufficient documentation

## 2015-05-30 DIAGNOSIS — F17200 Nicotine dependence, unspecified, uncomplicated: Secondary | ICD-10-CM | POA: Insufficient documentation

## 2015-05-30 DIAGNOSIS — S39012A Strain of muscle, fascia and tendon of lower back, initial encounter: Secondary | ICD-10-CM

## 2015-05-30 DIAGNOSIS — Y998 Other external cause status: Secondary | ICD-10-CM | POA: Insufficient documentation

## 2015-05-30 DIAGNOSIS — Y9289 Other specified places as the place of occurrence of the external cause: Secondary | ICD-10-CM | POA: Insufficient documentation

## 2015-05-30 MED ORDER — CYCLOBENZAPRINE HCL 10 MG PO TABS: 10.0000 mg | ORAL_TABLET | Freq: Two times a day (BID) | ORAL | Status: DC | PRN

## 2015-05-30 MED ORDER — NAPROXEN 500 MG PO TABS: 500.0000 mg | ORAL_TABLET | Freq: Two times a day (BID) | ORAL | Status: DC

## 2015-05-30 NOTE — ED Provider Notes (Signed)
CSN: [id number]     Arrival date & time 05/30/15  1400 History   First MD Initiated Contact with Patient 05/30/15 1515     Chief Complaint  Patient presents with  . Back Pain     (Consider location/radiation/quality/duration/timing/severity/associated sxs/prior Treatment) HPI  Stephen Villa is a 25 y.o M with no CVA past medical history presents the emergency department today complaining of back pain. Patient states that he was sitting at his desk yesterday when he felt gradual onset low back pain that is bilateral however, is worse on the right. Pain is worse with movement. Patient is able to ambulate but states it is painful. Patient has associated paresthesias in his right lower extremity that stops above his right knee. No trauma or injury. Denies bowel or bladder incontinence, dysuria, saddle anesthesia, fever, IV DA.  Past Medical History  Diagnosis Date  . Hypertension    Past Surgical History  Procedure Laterality Date  . Chest cartilage surgery     No family history on file. Social History  Substance Use Topics  . Smoking status: Current Every Day Smoker -- 0.50 packs/day  . Smokeless tobacco: None  . Alcohol Use: No    Review of Systems  All other systems reviewed and are negative.     Allergies  Atenolol  Home Medications   Prior to Admission medications   Medication Sig Start Date End Date Taking? Authorizing Provider  naproxen sodium (ANAPROX) 220 MG tablet Take 220 mg by mouth 2 (two) times daily as needed (pain).    Historical Provider, MD  traMADol (ULTRAM) 50 MG tablet Take 1 tablet (50 mg total) by mouth every 6 (six) hours as needed. 03/06/14   Teressa Lower, NP   BP 128/78 mmHg  Pulse 93  Temp(Src) 98.2 F (36.8 C) (Oral)  Resp 20  SpO2 96% Physical Exam  Constitutional: He is oriented to person, place, and time. He appears well-developed and well-nourished. No distress.  HENT:  Head: Normocephalic and atraumatic.  Eyes: Conjunctivae  are normal. Right eye exhibits no discharge. Left eye exhibits no discharge. No scleral icterus.  Neck: Normal range of motion. Neck supple.  Cardiovascular: Normal rate.   Pulmonary/Chest: Effort normal.  Musculoskeletal: Normal range of motion. He exhibits no edema.  TTP over R lumbar paraspinal muscles. No midline spinal tenderness. FROM of cervical, thoracic, and lumbar spine. No step offs or obvious bony deformities. Negative SLR.   Lymphadenopathy:    He has no cervical adenopathy.  Neurological: He is alert and oriented to person, place, and time. No cranial nerve deficit. He exhibits normal muscle tone. Coordination normal.  Strength 5/5 throughout. No sensory deficits.  No gait abnormality.  Skin: Skin is warm and dry. No rash noted. He is not diaphoretic. No erythema. No pallor.  Psychiatric: He has a normal mood and affect. His behavior is normal.  Nursing note and vitals reviewed.   ED Course  Procedures (including critical care time) Labs Review Labs Reviewed - No data to display  Imaging Review No results found. I have personally reviewed and evaluated these images and lab results as part of my medical decision-making.   EKG Interpretation None      MDM   Final diagnoses:  Lumbar strain, initial encounter    Otherwise healthy Patient presents with back pain. R lumbar paraspinal muscle tenderness on exam. No midline spinal tenderness.  No neurological deficits and normal neuro exam.  Patient is ambulatory in ED. No loss of bowel or bladder  control.  No concern for cauda equina.  No fever, night sweats, weight loss, h/o cancer, IVDU.  RICE protocol and pain medicine indicated and discussed with patient. Will d/c with flexeril and antiinflammatories. Return precautions outlined in patient discharge instructions.       Lester Kinsman [place], PA-C 05/31/15 1627  Melene Plan, DO 06/01/15 1735

## 2015-05-30 NOTE — ED Notes (Signed)
See triage rns notes and the pas  Saw only at d/c

## 2015-05-30 NOTE — ED Notes (Signed)
Pt c/o low back pain onset yesterday. Pt denies injury or urinary symptoms.

## 2015-05-30 NOTE — Discharge Instructions (Signed)
Back Injury Prevention Back injuries can be very painful. They can also be difficult to heal. After having one back injury, you are more likely to injure your back again. It is important to learn how to avoid injuring or re-injuring your back. The following tips can help you to prevent a back injury. WHAT SHOULD I KNOW ABOUT PHYSICAL FITNESS?  Exercise for 30 minutes per day on most days of the week or as directed by your health care provider. Make sure to:  Do aerobic exercises, such as walking, jogging, biking, or swimming.  Do exercises that increase balance and strength, such as tai chi and yoga. These can decrease your risk of falling and injuring your back.  Do stretching exercises to help with flexibility.  Try to develop strong abdominal muscles. Your abdominal muscles provide a lot of the support that is needed by your back.  Maintain a healthy weight. This helps to decrease your risk of a back injury. WHAT SHOULD I KNOW ABOUT MY DIET?  Talk with your health care provider about your overall diet. Take supplements and vitamins only as directed by your health care provider.  Talk with your health care provider about how much calcium and vitamin D you need each day. These nutrients help to prevent weakening of the bones (osteoporosis). Osteoporosis can cause broken (fractured) bones, which lead to back pain.  Include good sources of calcium in your diet, such as dairy products, green leafy vegetables, and products that have had calcium added to them (fortified).  Include good sources of vitamin D in your diet, such as milk and foods that are fortified with vitamin D. WHAT SHOULD I KNOW ABOUT MY POSTURE?  Sit up straight and stand up straight. Avoid leaning forward when you sit or hunching over when you stand.  Choose chairs that have good low-back (lumbar) support.  If you work at a desk, sit close to it so you do not need to lean over. Keep your chin tucked in. Keep your neck  drawn back, and keep your elbows bent at a right angle. Your arms should look like the letter "L."  Sit high and close to the steering wheel when you drive. Add a lumbar support to your car seat, if needed.  Avoid sitting or standing in one position for very long. Take breaks to get up, stretch, and walk around at least one time every hour. Take breaks every hour if you are driving for long periods of time.  Sleep on your side with your knees slightly bent, or sleep on your back with a pillow under your knees. Do not lie on the front of your body to sleep. WHAT SHOULD I KNOW ABOUT LIFTING, TWISTING, AND REACHING? Lifting and Heavy Lifting  Avoid heavy lifting, especially repetitive heavy lifting. If you must do heavy lifting:  Stretch before lifting.  Work slowly.  Rest between lifts.  Use a tool such as a cart or a dolly to move objects if one is available.  Make several small trips instead of carrying one heavy load.  Ask for help when you need it, especially when moving big objects.  Follow these steps when lifting:  Stand with your feet shoulder-width apart.  Get as close to the object as you can. Do not try to pick up a heavy object that is far from your body.  Use handles or lifting straps if they are available.  Bend at your knees. Squat down, but keep your heels off the floor.  Keep your shoulders pulled back, your chin tucked in, and your back straight. °¨ Lift the object slowly while you tighten the muscles in your legs, abdomen, and buttocks. Keep the object as close to the center of your body as possible. °· Follow these steps when putting down a heavy load: °¨ Stand with your feet shoulder-width apart. °¨ Lower the object slowly while you tighten the muscles in your legs, abdomen, and buttocks. Keep the object as close to the center of your body as possible. °¨ Keep your shoulders pulled back, your chin tucked in, and your back straight. °¨ Bend at your knees. Squat  down, but keep your heels off the floor. °¨ Use handles or lifting straps if they are available. °Twisting and Reaching °· Avoid lifting heavy objects above your waist. °· Do not twist at your waist while you are lifting or carrying a load. If you need to turn, move your feet. °· Do not bend over without bending at your knees. °· Avoid reaching over your head, across a table, or for an object on a high surface. °WHAT ARE SOME OTHER TIPS? °· Avoid wet floors and icy ground. Keep sidewalks clear of ice to prevent falls. °· Do not sleep on a mattress that is too soft or too hard. °· Keep items that are used frequently within easy reach. °· Put heavier objects on shelves at waist level, and put lighter objects on lower or higher shelves. °· Find ways to decrease your stress, such as exercise, massage, or relaxation techniques. Stress can build up in your muscles. Tense muscles are more vulnerable to injury. °· Talk with your health care provider if you feel anxious or depressed. These conditions can make back pain worse. °· Wear flat heel shoes with cushioned soles. °· Avoid sudden movements. °· Use both shoulder straps when carrying a backpack. °· Do not use any tobacco products, including cigarettes, chewing tobacco, or electronic cigarettes. If you need help quitting, ask your health care provider. °  °This information is not intended to replace advice given to you by your health care provider. Make sure you discuss any questions you have with your health care provider. °  °Document Released: 06/01/2004 Document Revised: 09/08/2014 Document Reviewed: 04/28/2014 °[employer]vier Interactive Patient Education ©2016 [employer] ° °Cryotherapy °Cryotherapy means treatment with cold. Ice or gel packs can be used to reduce both pain and swelling. Ice is the most helpful within the first 24 to 48 hours after an injury or flare-up from overusing a muscle or joint. Sprains, strains, spasms, burning pain, shooting pain, and aches  can all be eased with ice. Ice can also be used when recovering from surgery. Ice is effective, has very few side effects, and is safe for most people to use. °PRECAUTIONS  °Ice is not a safe treatment option for people with: °· Raynaud phenomenon. This is a condition affecting small blood vessels in the extremities. Exposure to cold may cause your problems to return. °· Cold hypersensitivity. There are many forms of cold hypersensitivity, including: °· Cold urticaria. Red, itchy hives appear on the skin when the tissues begin to warm after being iced. °· Cold erythema. This is a red, itchy rash caused by exposure to cold. °· Cold hemoglobinuria. Red blood cells break down when the tissues begin to warm after being iced. The hemoglobin that carry oxygen are passed into the urine because they cannot combine with blood proteins fast enough. °· Numbness or altered sensitivity in the area being   iced. °If you have any of the following conditions, do not use ice until you have discussed cryotherapy with your caregiver: °· Heart conditions, such as arrhythmia, angina, or chronic heart disease. °· High blood pressure. °· Healing wounds or open skin in the area being iced. °· Current infections. °· Rheumatoid arthritis. °· Poor circulation. °· Diabetes. °Ice slows the blood flow in the region it is applied. This is beneficial when trying to stop inflamed tissues from spreading irritating chemicals to surrounding tissues. However, if you expose your skin to cold temperatures for too long or without the proper protection, you can damage your skin or nerves. Watch for signs of skin damage due to cold. °HOME CARE INSTRUCTIONS °Follow these tips to use ice and cold packs safely. °· Place a dry or damp towel between the ice and skin. A damp towel will cool the skin more quickly, so you may need to shorten the time that the ice is used. °· For a more rapid response, add gentle compression to the ice. °· Ice for no more than 10 to  20 minutes at a time. The bonier the area you are icing, the less time it will take to get the benefits of ice. °· Check your skin after 5 minutes to make sure there are no signs of a poor response to cold or skin damage. °· Rest 20 minutes or more between uses. °· Once your skin is numb, you can end your treatment. You can test numbness by very lightly touching your skin. The touch should be so light that you do not see the skin dimple from the pressure of your fingertip. When using ice, most people will feel these normal sensations in this order: cold, burning, aching, and numbness. °· Do not use ice on someone who cannot communicate their responses to pain, such as small children or people with dementia. °HOW TO MAKE AN ICE PACK °Ice packs are the most common way to use ice therapy. Other methods include ice massage, ice baths, and cryosprays. Muscle creams that cause a cold, tingly feeling do not offer the same benefits that ice offers and should not be used as a substitute unless recommended by your caregiver. °To make an ice pack, do one of the following: °· Place crushed ice or a bag of frozen vegetables in a sealable plastic bag. Squeeze out the excess air. Place this bag inside another plastic bag. Slide the bag into a pillowcase or place a damp towel between your skin and the bag. °· Mix 3 parts water with 1 part rubbing alcohol. Freeze the mixture in a sealable plastic bag. When you remove the mixture from the freezer, it will be slushy. Squeeze out the excess air. Place this bag inside another plastic bag. Slide the bag into a pillowcase or place a damp towel between your skin and the bag. °SEEK MEDICAL CARE IF: °· You develop white spots on your skin. This may give the skin a blotchy (mottled) appearance. °· Your skin turns blue or pale. °· Your skin becomes waxy or hard. °· Your swelling gets worse. °MAKE SURE YOU:  °· Understand these instructions. °· Will watch your condition. °· Will get help right  away if you are not doing well or get worse. °  °This information is not intended to replace advice given to you by your health care provider. Make sure you discuss any questions you have with your health care provider. °  °Document Released: 12/19/2010 Document Revised: 05/15/2014 Document   Reviewed: 12/19/2010 Elsevier Interactive Patient Education 2016 [place] Strain With Rehab A strain is an injury in which a tendon or muscle is torn. The muscles and tendons of the lower back are vulnerable to strains. However, these muscles and tendons are very strong and require a great force to be injured. Strains are classified into three categories. Grade 1 strains cause pain, but the tendon is not lengthened. Grade 2 strains include a lengthened ligament, due to the ligament being stretched or partially ruptured. With grade 2 strains there is still function, although the function may be decreased. Grade 3 strains involve a complete tear of the tendon or muscle, and function is usually impaired. SYMPTOMS   Pain in the lower back.  Pain that affects one side more than the other.  Pain that gets worse with movement and may be felt in the hip, buttocks, or back of the thigh.  Muscle spasms of the muscles in the back.  Swelling along the muscles of the back.  Loss of strength of the back muscles.  Crackling sound (crepitation) when the muscles are touched. CAUSES  Lower back strains occur when a force is placed on the muscles or tendons that is greater than they can handle. Common causes of injury include:  Prolonged overuse of the muscle-tendon units in the lower back, usually from incorrect posture.  A single violent injury or force applied to the back. RISK INCREASES WITH:  Sports that involve twisting forces on the spine or a lot of bending at the waist (football, rugby, weightlifting, bowling, golf, tennis, speed skating, racquetball, swimming, running, gymnastics,  diving).  Poor strength and flexibility.  Failure to warm up properly before activity.  Family history of lower back pain or disk disorders.  Previous back injury or surgery (especially fusion).  Poor posture with lifting, especially heavy objects.  Prolonged sitting, especially with poor posture. PREVENTION   Learn and use proper posture when sitting or lifting (maintain proper posture when sitting, lift using the knees and legs, not at the waist).  Warm up and stretch properly before activity.  Allow for adequate recovery between workouts.  Maintain physical fitness:  Strength, flexibility, and endurance.  Cardiovascular fitness. PROGNOSIS  If treated properly, lower back strains usually heal within 6 weeks. RELATED COMPLICATIONS   Recurring symptoms, resulting in a chronic problem.  Chronic inflammation, scarring, and partial muscle-tendon tear.  Delayed healing or resolution of symptoms.  Prolonged disability. TREATMENT  Treatment first involves the use of ice and medicine, to reduce pain and inflammation. The use of strengthening and stretching exercises may help reduce pain with activity. These exercises may be performed at home or with a therapist. Severe injuries may require referral to a therapist for further evaluation and treatment, such as ultrasound. Your caregiver may advise that you wear a back brace or corset, to help reduce pain and discomfort. Often, prolonged bed rest results in greater harm then benefit. Corticosteroid injections may be recommended. However, these should be reserved for the most serious cases. It is important to avoid using your back when lifting objects. At night, sleep on your back on a firm mattress with a pillow placed under your knees. If non-surgical treatment is unsuccessful, surgery may be needed.  MEDICATION   If pain medicine is needed, nonsteroidal anti-inflammatory medicines (aspirin and ibuprofen), or other minor pain  relievers (acetaminophen), are often advised.  Do not take pain medicine for 7 days before surgery.  Prescription pain relievers may be  given, if your caregiver thinks they are needed. Use only as directed and only as much as you need. °· Ointments applied to the skin may be helpful. °· Corticosteroid injections may be given by your caregiver. These injections should be reserved for the most serious cases, because they may only be given a certain number of times. °HEAT AND COLD °· Cold treatment (icing) should be applied for 10 to 15 minutes every 2 to 3 hours for inflammation and pain, and immediately after activity that aggravates your symptoms. Use ice packs or an ice massage. °· Heat treatment may be used before performing stretching and strengthening activities prescribed by your caregiver, physical therapist, or athletic trainer. Use a heat pack or a warm water soak. °SEEK MEDICAL CARE IF:  °· Symptoms get worse or do not improve in 2 to 4 weeks, despite treatment. °· You develop numbness, weakness, or loss of bowel or bladder function. °· New, unexplained symptoms develop. (Drugs used in treatment may produce side effects.) °EXERCISES  °RANGE OF MOTION (ROM) AND STRETCHING EXERCISES - Low Back Strain °Most people with lower back pain will find that their symptoms get worse with excessive bending forward (flexion) or arching at the lower back (extension). The exercises which will help resolve your symptoms will focus on the opposite motion.  °Your physician, physical therapist or athletic trainer will help you determine which exercises will be most helpful to resolve your lower back pain. Do not complete any exercises without first consulting with your caregiver. Discontinue any exercises which make your symptoms worse until you speak to your caregiver.  °If you have pain, numbness or tingling which travels down into your buttocks, leg or foot, the goal of the therapy is for these symptoms to move closer  to your back and eventually resolve. Sometimes, these leg symptoms will get better, but your lower back pain may worsen. This is typically an indication of progress in your rehabilitation. Be very alert to any changes in your symptoms and the activities in which you participated in the 24 hours prior to the change. Sharing this information with your caregiver will allow him/her to most efficiently treat your condition. °· These exercises may help you when beginning to rehabilitate your injury. Your symptoms may resolve with or without further involvement from your physician, physical therapist or athletic trainer. While completing these exercises, remember: °· Restoring tissue flexibility helps normal motion to return to the joints. This allows healthier, less painful movement and activity. °· An effective stretch should be held for at least 30 seconds. °· A stretch should never be painful. You should only feel a gentle lengthening or release in the stretched tissue. °FLEXION RANGE OF MOTION AND STRETCHING EXERCISES: °STRETCH - Flexion, Single Knee to Chest  °· Lie on a firm bed or floor with both legs extended in front of you. °· Keeping one leg in contact with the floor, bring your opposite knee to your chest. Hold your leg in place by either grabbing behind your thigh or at your knee. °· Pull until you feel a gentle stretch in your lower back. Hold __________ seconds. °· Slowly release your grasp and repeat the exercise with the opposite side. °Repeat __________ times. Complete this exercise __________ times per day.  °STRETCH - Flexion, Double Knee to Chest  °· Lie on a firm bed or floor with both legs extended in front of you. °· Keeping one leg in contact with the floor, bring your opposite knee to your chest. °·   Tense your stomach muscles to support your back and then lift your other knee to your chest. Hold your legs in place by either grabbing behind your thighs or at your knees.  Pull both knees toward  your chest until you feel a gentle stretch in your lower back. Hold __________ seconds.  Tense your stomach muscles and slowly return one leg at a time to the floor. Repeat __________ times. Complete this exercise __________ times per day.  STRETCH - Low Trunk Rotation  Lie on a firm bed or floor. Keeping your legs in front of you, bend your knees so they are both pointed toward the ceiling and your feet are flat on the floor.  Extend your arms out to the side. This will stabilize your upper body by keeping your shoulders in contact with the floor.  Gently and slowly drop both knees together to one side until you feel a gentle stretch in your lower back. Hold for __________ seconds.  Tense your stomach muscles to support your lower back as you bring your knees back to the starting position. Repeat the exercise to the other side. Repeat __________ times. Complete this exercise __________ times per day  EXTENSION RANGE OF MOTION AND FLEXIBILITY EXERCISES: STRETCH - Extension, Prone on Elbows   Lie on your stomach on the floor, a bed will be too soft. Place your palms about shoulder width apart and at the height of your head.  Place your elbows under your shoulders. If this is too painful, stack pillows under your chest.  Allow your body to relax so that your hips drop lower and make contact more completely with the floor.  Hold this position for __________ seconds.  Slowly return to lying flat on the floor. Repeat __________ times. Complete this exercise __________ times per day.  RANGE OF MOTION - Extension, Prone Press Ups  Lie on your stomach on the floor, a bed will be too soft. Place your palms about shoulder width apart and at the height of your head.  Keeping your back as relaxed as possible, slowly straighten your elbows while keeping your hips on the floor. You may adjust the placement of your hands to maximize your comfort. As you gain motion, your hands will come more  underneath your shoulders.  Hold this position __________ seconds.  Slowly return to lying flat on the floor. Repeat __________ times. Complete this exercise __________ times per day.  RANGE OF MOTION- Quadruped, Neutral Spine   Assume a hands and knees position on a firm surface. Keep your hands under your shoulders and your knees under your hips. You may place padding under your knees for comfort.  Drop your head and point your tail bone toward the ground below you. This will round out your lower back like an angry cat. Hold this position for __________ seconds.  Slowly lift your head and release your tail bone so that your back sags into a large arch, like an old horse.  Hold this position for __________ seconds.  Repeat this until you feel limber in your lower back.  Now, find your "sweet spot." This will be the most comfortable position somewhere between the two previous positions. This is your neutral spine. Once you have found this position, tense your stomach muscles to support your lower back.  Hold this position for __________ seconds. Repeat __________ times. Complete this exercise __________ times per day.  STRENGTHENING EXERCISES - Low Back Strain These exercises may help you when beginning to rehabilitate  your injury. These exercises should be done near your "sweet spot." This is the neutral, low-back arch, somewhere between fully rounded and fully arched, that is your least painful position. When performed in this safe range of motion, these exercises can be used for people who have either a flexion or extension based injury. These exercises may resolve your symptoms with or without further involvement from your physician, physical therapist or athletic trainer. While completing these exercises, remember:  °· Muscles can gain both the endurance and the strength needed for everyday activities through controlled exercises. °· Complete these exercises as instructed by your  physician, physical therapist or athletic trainer. Increase the resistance and repetitions only as guided. °· You may experience muscle soreness or fatigue, but the pain or discomfort you are trying to eliminate should never worsen during these exercises. If this pain does worsen, stop and make certain you are following the directions exactly. If the pain is still present after adjustments, discontinue the exercise until you can discuss the trouble with your caregiver. °STRENGTHENING - Deep Abdominals, Pelvic Tilt °· Lie on a firm bed or floor. Keeping your legs in front of you, bend your knees so they are both pointed toward the ceiling and your feet are flat on the floor. °· Tense your lower abdominal muscles to press your lower back into the floor. This motion will rotate your pelvis so that your tail bone is scooping upwards rather than pointing at your feet or into the floor. °· With a gentle tension and even breathing, hold this position for __________ seconds. °Repeat __________ times. Complete this exercise __________ times per day.  °STRENGTHENING - Abdominals, Crunches  °· Lie on a firm bed or floor. Keeping your legs in front of you, bend your knees so they are both pointed toward the ceiling and your feet are flat on the floor. Cross your arms over your chest. °· Slightly tip your chin down without bending your neck. °· Tense your abdominals and slowly lift your trunk high enough to just clear your shoulder blades. Lifting higher can put excessive stress on the lower back and does not further strengthen your abdominal muscles. °· Control your return to the starting position. °Repeat __________ times. Complete this exercise __________ times per day.  °STRENGTHENING - Quadruped, Opposite UE/LE Lift  °· Assume a hands and knees position on a firm surface. Keep your hands under your shoulders and your knees under your hips. You may place padding under your knees for comfort. °· Find your neutral spine and  gently tense your abdominal muscles so that you can maintain this position. Your shoulders and hips should form a rectangle that is parallel with the floor and is not twisted. °· Keeping your trunk steady, lift your right hand no higher than your shoulder and then your left leg no higher than your hip. Make sure you are not holding your breath. Hold this position __________ seconds. °· Continuing to keep your abdominal muscles tense and your back steady, slowly return to your starting position. Repeat with the opposite arm and leg. °Repeat __________ times. Complete this exercise __________ times per day.  °STRENGTHENING - Lower Abdominals, Double Knee Lift °· Lie on a firm bed or floor. Keeping your legs in front of you, bend your knees so they are both pointed toward the ceiling and your feet are flat on the floor. °· Tense your abdominal muscles to brace your lower back and slowly lift both of your knees until they come   over your hips. Be certain not to hold your breath.  Hold __________ seconds. Using your abdominal muscles, return to the starting position in a slow and controlled manner. Repeat __________ times. Complete this exercise __________ times per day.  POSTURE AND BODY MECHANICS CONSIDERATIONS - Low Back Strain Keeping correct posture when sitting, standing or completing your activities will reduce the stress put on different body tissues, allowing injured tissues a chance to heal and limiting painful experiences. The following are general guidelines for improved posture. Your physician or physical therapist will provide you with any instructions specific to your needs. While reading these guidelines, remember:  The exercises prescribed by your provider will help you have the flexibility and strength to maintain correct postures.  The correct posture provides the best environment for your joints to work. All of your joints have less wear and tear when properly supported by a spine with good  posture. This means you will experience a healthier, less painful body.  Correct posture must be practiced with all of your activities, especially prolonged sitting and standing. Correct posture is as important when doing repetitive low-stress activities (typing) as it is when doing a single heavy-load activity (lifting). RESTING POSITIONS Consider which positions are most painful for you when choosing a resting position. If you have pain with flexion-based activities (sitting, bending, stooping, squatting), choose a position that allows you to rest in a less flexed posture. You would want to avoid curling into a fetal position on your side. If your pain worsens with extension-based activities (prolonged standing, working overhead), avoid resting in an extended position such as sleeping on your stomach. Most people will find more comfort when they rest with their spine in a more neutral position, neither too rounded nor too arched. Lying on a non-sagging bed on your side with a pillow between your knees, or on your back with a pillow under your knees will often provide some relief. Keep in mind, being in any one position for a prolonged period of time, no matter how correct your posture, can still lead to stiffness. PROPER SITTING POSTURE In order to minimize stress and discomfort on your spine, you must sit with correct posture. Sitting with good posture should be effortless for a healthy body. Returning to good posture is a gradual process. Many people can work toward this most comfortably by using various supports until they have the flexibility and strength to maintain this posture on their own. When sitting with proper posture, your ears will fall over your shoulders and your shoulders will fall over your hips. You should use the back of the chair to support your upper back. Your lower back will be in a neutral position, just slightly arched. You may place a small pillow or folded towel at the base of  your lower back for support.  When working at a desk, create an environment that supports good, upright posture. Without extra support, muscles tire, which leads to excessive strain on joints and other tissues. Keep these recommendations in mind: CHAIR:  A chair should be able to slide under your desk when your back makes contact with the back of the chair. This allows you to work closely.  The chair's height should allow your eyes to be level with the upper part of your monitor and your hands to be slightly lower than your elbows. BODY POSITION  Your feet should make contact with the floor. If this is not possible, use a foot rest.  Keep your ears  over your shoulders. This will reduce stress on your neck and lower back. INCORRECT SITTING POSTURES  If you are feeling tired and unable to assume a healthy sitting posture, do not slouch or slump. This puts excessive strain on your back tissues, causing more damage and pain. Healthier options include:  Using more support, like a lumbar pillow.  Switching tasks to something that requires you to be upright or walking.  Talking a brief walk.  Lying down to rest in a neutral-spine position. PROLONGED STANDING WHILE SLIGHTLY LEANING FORWARD  When completing a task that requires you to lean forward while standing in one place for a long time, place either foot up on a stationary 2-4 inch high object to help maintain the best posture. When both feet are on the ground, the lower back tends to lose its slight inward curve. If this curve flattens (or becomes too large), then the back and your other joints will experience too much stress, tire more quickly, and can cause pain. CORRECT STANDING POSTURES Proper standing posture should be assumed with all daily activities, even if they only take a few moments, like when brushing your teeth. As in sitting, your ears should fall over your shoulders and your shoulders should fall over your hips. You should keep  a slight tension in your abdominal muscles to brace your spine. Your tailbone should point down to the ground, not behind your body, resulting in an over-extended swayback posture.  INCORRECT STANDING POSTURES  Common incorrect standing postures include a forward head, locked knees and/or an excessive swayback. WALKING Walk with an upright posture. Your ears, shoulders and hips should all line-up. PROLONGED ACTIVITY IN A FLEXED POSITION When completing a task that requires you to bend forward at your waist or lean over a low surface, try to find a way to stabilize 3 out of 4 of your limbs. You can place a hand or elbow on your thigh or rest a knee on the surface you are reaching across. This will provide you more stability so that your muscles do not fatigue as quickly. By keeping your knees relaxed, or slightly bent, you will also reduce stress across your lower back. CORRECT LIFTING TECHNIQUES DO :   Assume a wide stance. This will provide you more stability and the opportunity to get as close as possible to the object which you are lifting.  Tense your abdominals to brace your spine. Bend at the knees and hips. Keeping your back locked in a neutral-spine position, lift using your leg muscles. Lift with your legs, keeping your back straight.  Test the weight of unknown objects before attempting to lift them.  Try to keep your elbows locked down at your sides in order get the best strength from your shoulders when carrying an object.  Always ask for help when lifting heavy or awkward objects. INCORRECT LIFTING TECHNIQUES DO NOT:   Lock your knees when lifting, even if it is a small object.  Bend and twist. Pivot at your feet or move your feet when needing to change directions.  Assume that you can safely pick up even a paper clip without proper posture.   This information is not intended to replace advice given to you by your health care provider. Make sure you discuss any questions you  have with your health care provider.  Follow-up with her primary care provider is symptoms don't improve. Take naproxen and Flexeril as needed for back pain. Encourage back exercises as indicated above in  attachment. Apply ice to affected area. Return to the emergency department if you experience severe increase in her pain, bowel or bladder incontinence, numbness or tingling in both of your extremities, burning with urination, fever.

## 2016-05-12 IMAGING — CR DG KNEE COMPLETE 4+V*R*
4 series · 4 of 4 positions shown · non-contrast
Comparison: None.

CLINICAL DATA: 23-year-old male with 3-4 week history of right knee
pain and swelling. If pain localizes medial aspect of the knee. No
known injury.

EXAM:
RIGHT KNEE - COMPLETE 4+ VIEW

[view not recorded (1 of 4)]
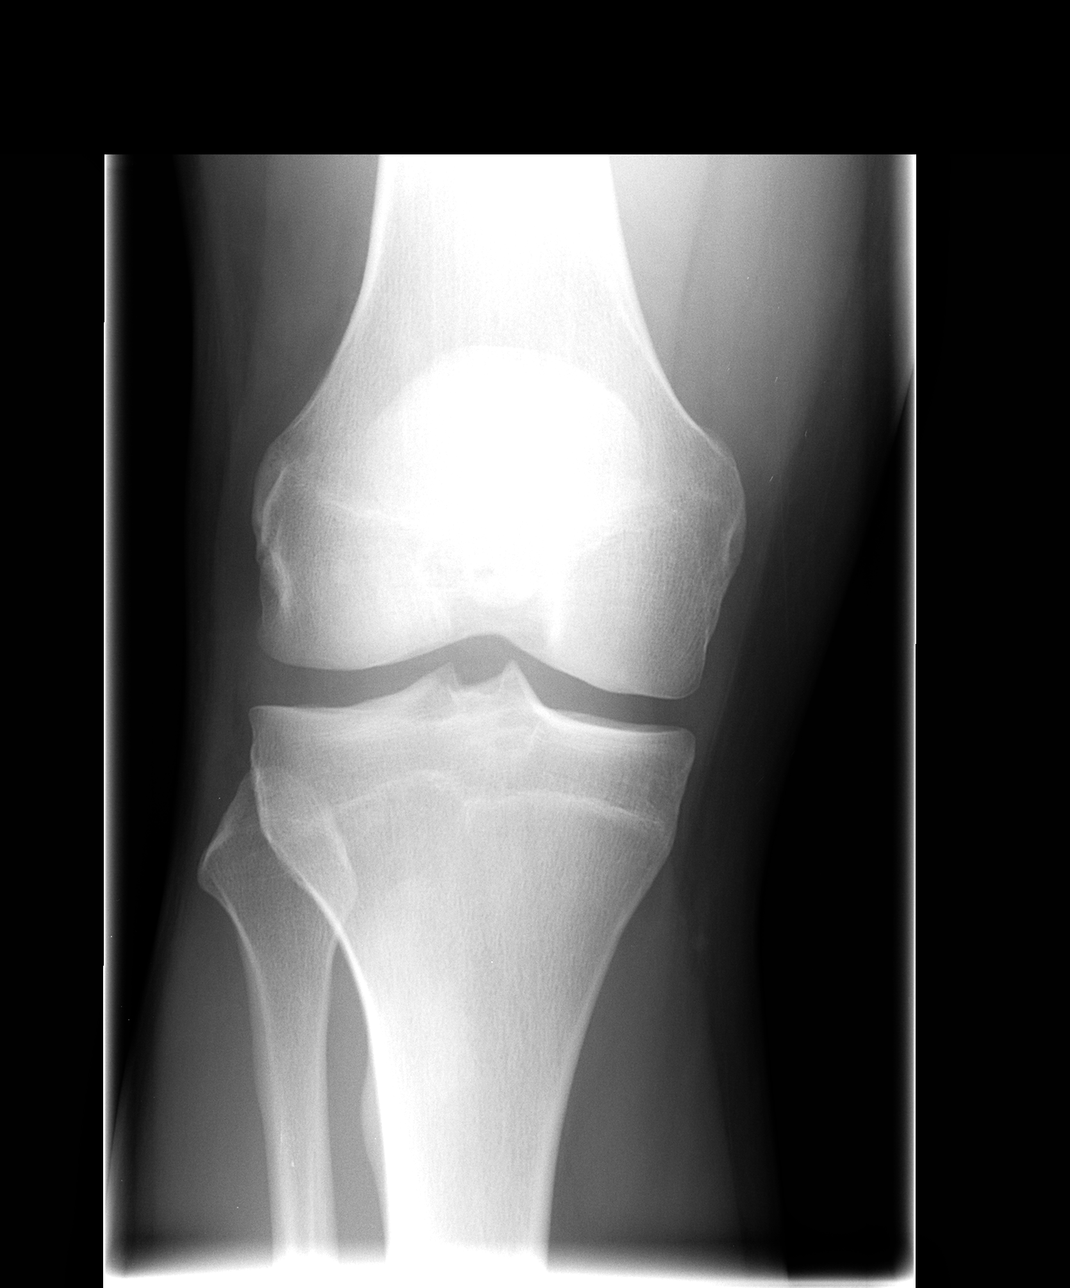

[view not recorded (2 of 4)]
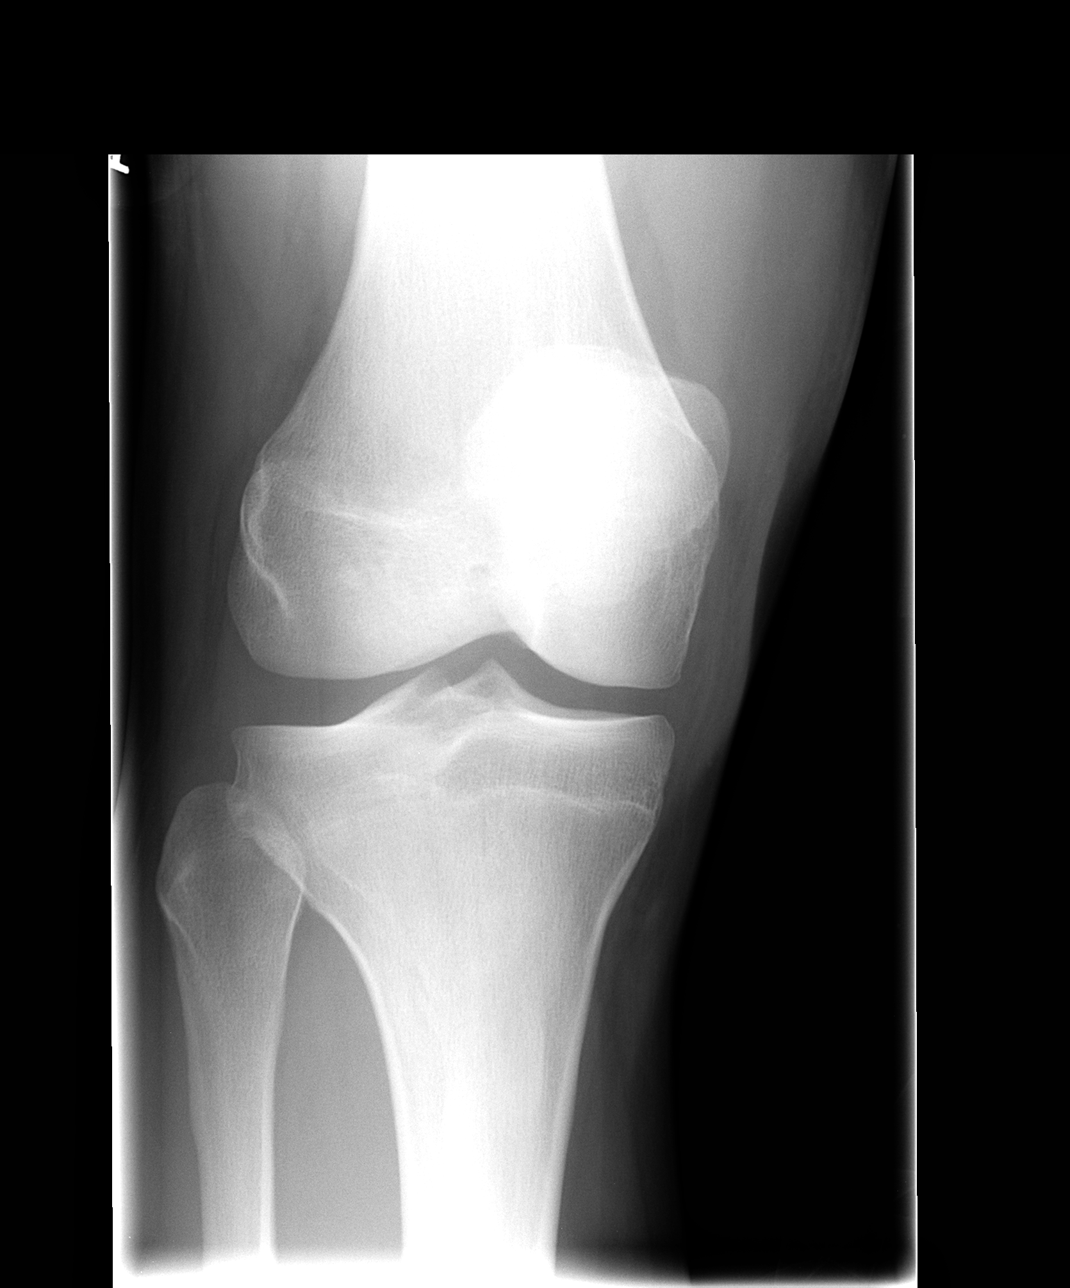

[view not recorded (3 of 4)]
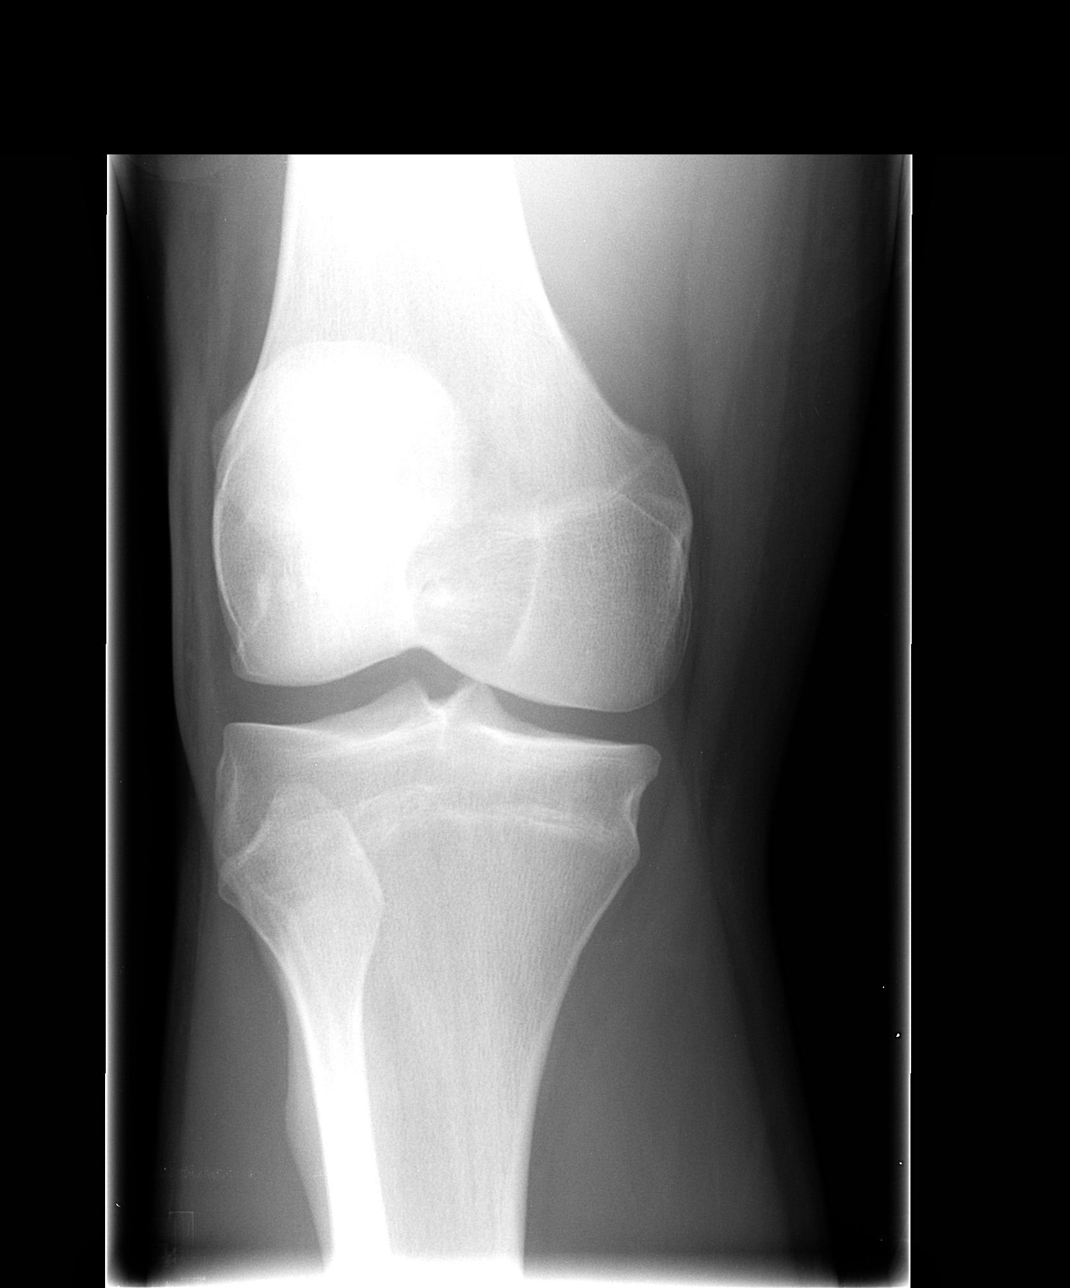

[view not recorded (4 of 4)]
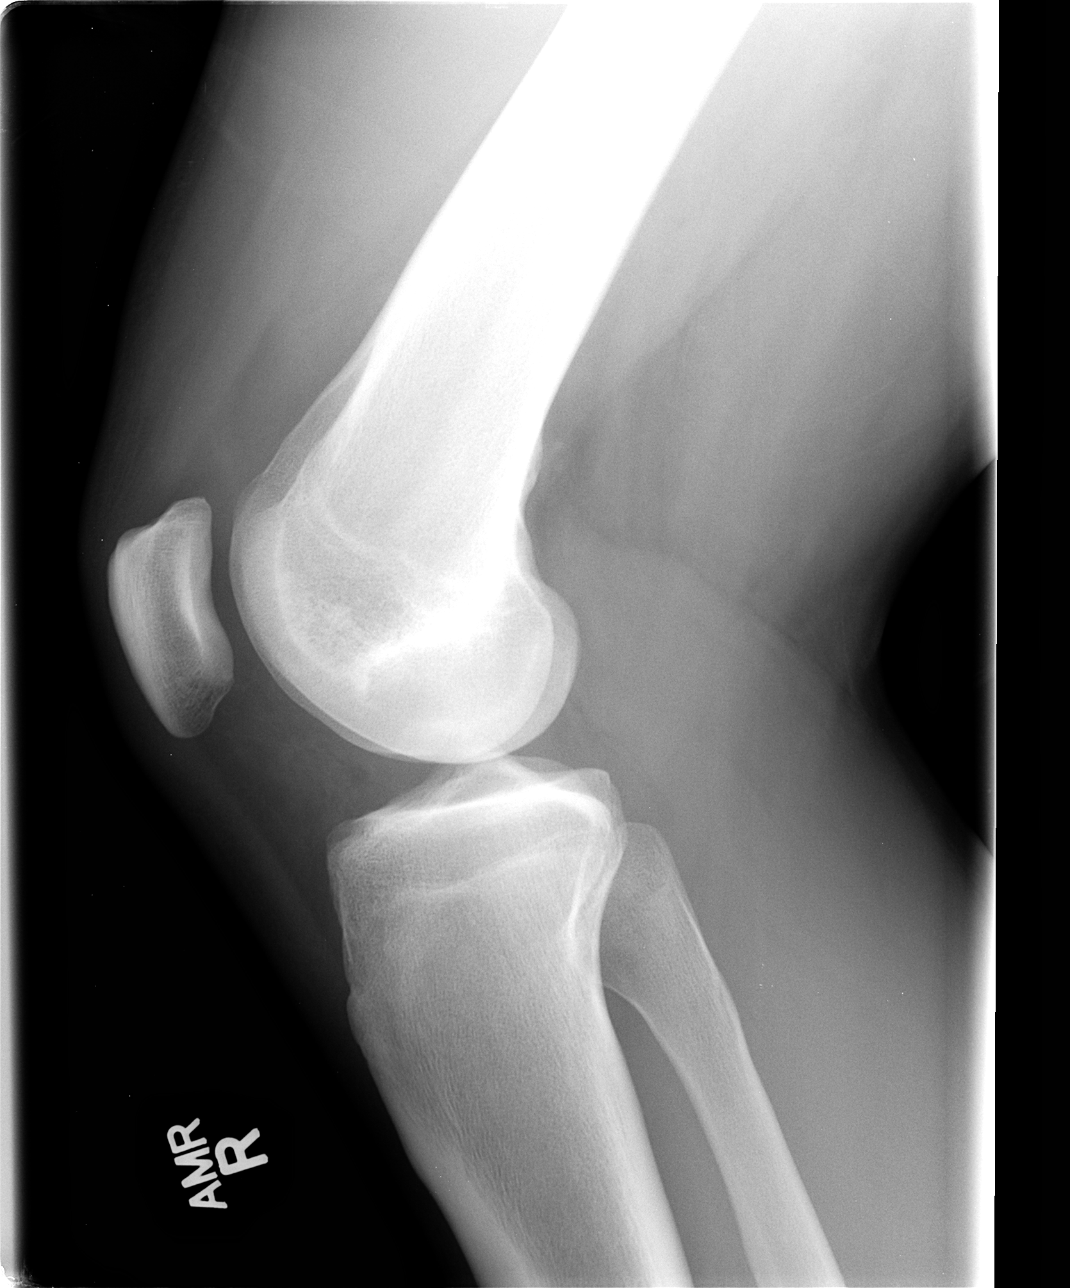

[4 of 4 positions shown; findings below may reference images not displayed]

FINDINGS: There is no evidence of fracture, dislocation, or joint effusion.
There is no evidence of arthropathy or other focal bone abnormality.
Soft tissues are unremarkable.
IMPRESSION: Negative.

## 2016-08-19 ENCOUNTER — Ambulatory Visit (HOSPITAL_COMMUNITY): Admission: EM | Admit: 2016-08-19 | Discharge: 2016-08-19 | Discharge status: Against Medical Advice | Payer: Self-pay

## 2017-11-18 ENCOUNTER — Emergency Department (HOSPITAL_COMMUNITY)
Admission: EM | Admit: 2017-11-18 | Discharge: 2017-11-19 | Disposition: A | Discharge status: Home / Self Care | Payer: 59 | Attending: Emergency Medicine | Admitting: Emergency Medicine

## 2017-11-18 ENCOUNTER — Encounter (HOSPITAL_COMMUNITY): Payer: Self-pay | Admitting: Emergency Medicine

## 2017-11-18 DIAGNOSIS — Z79899 Other long term (current) drug therapy: Secondary | ICD-10-CM | POA: Diagnosis not present

## 2017-11-18 DIAGNOSIS — R55 Syncope and collapse: Secondary | ICD-10-CM | POA: Diagnosis present

## 2017-11-18 DIAGNOSIS — F17210 Nicotine dependence, cigarettes, uncomplicated: Secondary | ICD-10-CM | POA: Insufficient documentation

## 2017-11-18 LAB — CBC
HEMATOCRIT: 45.7 % (ref 39.0–52.0)
Hemoglobin: 15.3 g/dL (ref 13.0–17.0)
MCH: 31.6 pg (ref 26.0–34.0)
MCHC: 33.5 g/dL (ref 30.0–36.0)
MCV: 94.4 fL (ref 78.0–100.0)
PLATELETS: 178 10*3/uL (ref 150–400)
RBC: 4.84 MIL/uL (ref 4.22–5.81)
RDW: 11.9 % (ref 11.5–15.5)
WBC: 13 10*3/uL — AB (ref 4.0–10.5)

## 2017-11-18 LAB — BASIC METABOLIC PANEL
Anion gap: 8 (ref 5–15)
BUN: 14 mg/dL (ref 6–20)
CO2: 22 mmol/L (ref 22–32)
Calcium: 8.6 mg/dL — ABNORMAL LOW (ref 8.9–10.3)
Chloride: 110 mmol/L (ref 98–111)
Creatinine, Ser: 1.12 mg/dL (ref 0.61–1.24)
Glucose, Bld: 134 mg/dL — ABNORMAL HIGH (ref 70–99)
POTASSIUM: 3.8 mmol/L (ref 3.5–5.1)
SODIUM: 140 mmol/L (ref 135–145)

## 2017-11-18 NOTE — ED Provider Notes (Signed)
[facility] EMERGENCY DEPARTMENT Provider Note   CSN: [id number] Arrival date & time: 11/18/17  2239     History   Chief Complaint Chief Complaint  Patient presents with  . Loss of Consciousness    HPI Stephen Villa is a 27 y.o. male.  The history is provided by the patient and medical records.  Loss of Consciousness      27 y.o. M with hx of HTN, presenting to the ED after a syncopal episode.  Patient states he worked all day, did not eat but had been drinking water.  States he was walking to his car, co-workers saw him start to get into his car and sat down in the seat and was leaning out of the car and appears unconscious for a few seconds.  Patient states the last thing he remembered was feeling very hot and flushed, no dizziness, vision changes, chest pain, or SOB.  States he did spit up some blood today (did not cough it up) but this is related to tooth that he is supposed to have oral surgery on soon.  States his left lower wisdom tooth is growing in sideways, has pushed on his other teeth and has caused some of them to break.  States it bleeds from time to time.  He denies any chest pain, palpitations, shortness of breath.  No recent surgery, prolonged immobilization, calf swelling, calf pain, history of DVT or PE.  Past Medical History:  Diagnosis Date  . Hypertension     There are no active problems to display for this patient.   Past Surgical History:  Procedure Laterality Date  . chest cartilage surgery          Home Medications    Prior to Admission medications   Medication Sig Start Date End Date Taking? Authorizing Provider  cyclobenzaprine (FLEXERIL) 10 MG tablet Take 1 tablet (10 mg total) by mouth 2 (two) times daily as needed for muscle spasms. 05/30/15   Dowless, Lelon Mast Tripp, PA-C  naproxen (NAPROSYN) 500 MG tablet Take 1 tablet (500 mg total) by mouth 2 (two) times daily. 05/30/15   Dowless, Lelon Mast Tripp, PA-C  naproxen  sodium (ANAPROX) 220 MG tablet Take 220 mg by mouth 2 (two) times daily as needed (pain).    [provider]  traMADol (ULTRAM) 50 MG tablet Take 1 tablet (50 mg total) by mouth every 6 (six) hours as needed. 03/06/14   Teressa Lower, NP    Family History No family history on file.  Social History Social History   Tobacco Use  . Smoking status: Current Every Day Smoker    Packs/day: 0.50  . Smokeless tobacco: Never Used  Substance Use Topics  . Alcohol use: No  . Drug use: No     Allergies   Atenolol   Review of Systems Review of Systems  HENT: Positive for dental problem.   Cardiovascular: Positive for syncope.  Neurological: Positive for syncope.  All other systems reviewed and are negative.    Physical Exam Updated Vital Signs BP (!) 148/91 (BP Location: Right Arm)   Pulse (!) 105   Temp 98.2 F (36.8 C) (Oral)   Resp 16   Ht 6\' 1"  (1.854 m)   Wt 104.3 kg (230 lb)   SpO2 99%   BMI 30.34 kg/m   Physical Exam  Constitutional: He is oriented to person, place, and time. He appears well-developed and well-nourished.  HENT:  Head: Normocephalic and atraumatic.  Mouth/Throat: Oropharynx is  clear and moist.  Left lower wisdom tooth appears partially erupted through the gums, left lower molars appear fractured, some very mild bleeding noted; no apparent abscess or other signs of infection; no facial or neck swelling, handling secretions well, normal phonation without stridor No visible signs of head trauma  Eyes: Pupils are equal, round, and reactive to light. Conjunctivae and EOM are normal.  Neck: Normal range of motion.  Cardiovascular: Normal rate, regular rhythm and normal heart sounds.  Pulmonary/Chest: Effort normal and breath sounds normal. No stridor. No respiratory distress.  Abdominal: Soft. Bowel sounds are normal. There is no tenderness. There is no rebound.  Musculoskeletal: Normal range of motion.  Neurological: He is alert and oriented  to person, place, and time.  AAOx3, answering questions and following commands appropriately; equal strength UE and LE bilaterally; CN grossly intact; moves all extremities appropriately without ataxia; no focal neuro deficits or facial asymmetry appreciated  Skin: Skin is warm and dry.  Psychiatric: He has a normal mood and affect.  Nursing note and vitals reviewed.    ED Treatments / Results  Labs (all labs ordered are listed, but only abnormal results are displayed) Labs Reviewed  BASIC METABOLIC PANEL - Abnormal; Notable for the following components:      Result Value   Glucose, Bld 134 (*)    Calcium 8.6 (*)    All other components within normal limits  CBC - Abnormal; Notable for the following components:   WBC 13.0 (*)    All other components within normal limits    EKG None  Radiology No results found.  Procedures Procedures (including critical care time)  Medications Ordered in ED Medications - No data to display   Initial Impression / Assessment and Plan / ED Course  I have reviewed the triage vital signs and the nursing notes.  Pertinent labs & imaging results that were available during my care of the patient were reviewed by me and considered in my medical decision making (see chart for details).  27 year old male here after apparent syncopal episode.  He remembers feeling hot and flushed, went to get in his car and apparently lost consciousness for a few seconds per friends.  There was no fall or direct head trauma.  States he was spitting out some blood today but this is secondary to wisdom tooth that is growing in crooked and has broken some of his other lower molars, due for oral surgery soon but no date set yet.  Here he is awake, alert, appropriately oriented.  Mild tachycardia on arrival at rate of 105.  Denies any chest pain, shortness of breath, palpitations, current dizziness, or feelings of syncope.  States he has not eaten anything all day today but did  drink a few cups of water.  No signs of dental abscess or ludwig's angina on exam.  Small amount of blood noted along the left lower molars, teeth fractured.  EKG is nonischemic.  Labs pending.  Given IV fluids.  Labs overall reassuring.  Patient's heart rate has returned to normal in the 80s.  Continues to deny any chest pain or shortness of breath, states he feels ok.  Drinking water here without issue.  He has no significant risk factors for PE and I feel this is less likely.  Syncope likely related to poor oral intake today.  Will discharge home and have him follow-up closely with PCP.  Encouraged regular meals and fluids, especially when in the heat.  He will follow-up with  his dentist about this and she is due for surgery soon.  Discussed plan with patient, he acknowledged understanding and agreed with plan of care.  Return precautions given for new or worsening symptoms.  Final Clinical Impressions(s) / ED Diagnoses   Final diagnoses:  Syncope, unspecified syncope type    ED Discharge Orders    None       Garlon HatchetSanders, Lisa M, PA-C 11/19/17 [id number]    Geoffery Lyonselo, Douglas, MD 11/19/17 [phone]

## 2017-11-18 NOTE — ED Triage Notes (Signed)
Pt transported from work after ? Syncope, pt does not recall events but co workers said "he passed out" pt walked to his vehicle and sat in doorway of his car. Pt reports he "coughed" up blood today, pt states he has a tooth that bleeds and he feels blood is coming from that area.  IV est, NS given, A & O on arrival. NAD.

## 2017-11-19 NOTE — Discharge Instructions (Signed)
Your labs looked great today. Try to eat/drink regularly. Follow-up with your dentist as scheduled about your tooth. Return here for any new/acute changes.

## 2017-11-24 ENCOUNTER — Ambulatory Visit
Admission: EM | Admit: 2017-11-24 | Discharge: 2017-11-24 | Disposition: A | Discharge status: Home / Self Care | Payer: 59 | Attending: Family Medicine | Admitting: Family Medicine

## 2017-11-24 ENCOUNTER — Encounter: Payer: Self-pay | Admitting: Emergency Medicine

## 2017-11-24 ENCOUNTER — Other Ambulatory Visit: Payer: Self-pay

## 2017-11-24 DIAGNOSIS — F17210 Nicotine dependence, cigarettes, uncomplicated: Secondary | ICD-10-CM | POA: Diagnosis not present

## 2017-11-24 DIAGNOSIS — F329 Major depressive disorder, single episode, unspecified: Secondary | ICD-10-CM

## 2017-11-24 MED ORDER — BUPROPION HCL ER (XL) 150 MG PO TB24: 150.0000 mg | ORAL_TABLET | Freq: Every day | ORAL | 0 refills | Status: AC

## 2017-11-24 NOTE — ED Provider Notes (Signed)
[facility]    CSN: [id number] Arrival date & time: 11/24/17  1230     History   Chief Complaint Chief Complaint  Patient presents with  . Stress    HPI Stephen Villa is a 27 y.o. male.   27 year old male presents with depressed mood, increased stress, difficulty sleeping and eating for the past few months. Separated from his wife in November 2018. Has a young son with his wife and asked to see him during the winter but she declined and he did not have proof that he tried to see him so his wife received full custody since he had "abandoned" his son (not seen him in 6 months). He has been very upset and irritable. He recently got back together with an old girlfriend but now they are fighting all the time. He sees a therapist in [place] once to twice a month but therapy does not seem to be helping. His mother does not talk to him. He has not talked to his grandmother in years and a close friend at work is moving away. He is close with his girlfriend's 33 year old son and enjoys spending time with him. His work involves driving tow trucks and he has had more complaints lately at work due to becoming more upset than usual with clients. He works mostly at night so has erratic sleep schedule but has not sleep more than 2 to 3 hours in the past 48 hours. He often drinks "monster drinks" to help stay awake. He does smoke 1/2 to 1 ppd of cigarettes. He ocassionally drinks alcohol but has not consumed any in the past week. He denies any illicit drug use. He has no history of previous depression or anxiety and has never been on medication. He denies any suicidal or homicidal thoughts or plans. He was seen on 7/14 at [facility] ER for a syncope episode that was thought to be due to dehydration. He also has an impacted wisdom tooth that he his saving up to be repaired. The ER placed him on Amoxicillin for possible dental infection and also gave him Norco for pain in which he has not taken  any in the past 4 days. He finds the most relief with BC powders applied directly to the tooth/gum. Other chronic health issues include HTN but not currently on medication. He does not currently have a PCP.   The history is provided by the patient.    Past Medical History:  Diagnosis Date  . Hypertension     There are no active problems to display for this patient.   Past Surgical History:  Procedure Laterality Date  . chest cartilage surgery         Home Medications    Prior to Admission medications   Medication Sig Start Date End Date Taking? Authorizing Provider  amoxicillin (AMOXIL) 500 MG capsule Take 500 mg by mouth 3 (three) times daily. 11/05/17  Yes [provider]  Aspirin-Salicylamide-Caffeine (BC HEADACHE POWDER PO) Take 1 packet by mouth daily as needed (dental pain).   Yes [provider]  HYDROcodone-acetaminophen (NORCO) 7.5-325 MG tablet Take 1 tablet by mouth every 4 (four) hours as needed for moderate pain.  11/05/17  Yes [provider]  buPROPion (WELLBUTRIN XL) 150 MG 24 hr tablet Take 1 tablet (150 mg total) by mouth daily. 11/24/17   Sudie Grumbling, NP    Family History Family History  Problem Relation Age of Onset  . Hypertension  Mother   . Cervical cancer Mother   . Multiple sclerosis Father     Social History Social History   Tobacco Use  . Smoking status: Current Every Day Smoker    Packs/day: 0.50    Types: Cigarettes  . Smokeless tobacco: Never Used  Substance Use Topics  . Alcohol use: No  . Drug use: No     Allergies   Atenolol   Review of Systems Review of Systems  Constitutional: Positive for appetite change and fatigue. Negative for activity change, chills, fever and unexpected weight change.  HENT: Positive for dental problem. Negative for mouth sores and sore throat.   Eyes: Negative for photophobia and visual disturbance.  Respiratory: Negative for cough, chest tightness, shortness of breath  and wheezing.   Cardiovascular: Negative for chest pain and palpitations.  Gastrointestinal: Negative for abdominal pain, constipation, diarrhea, nausea and vomiting.  Musculoskeletal: Negative for arthralgias and myalgias.  Skin: Negative for rash and wound.  Allergic/Immunologic: Negative for environmental allergies and immunocompromised state.  Neurological: Positive for syncope (about 1 week ago- none since). Negative for dizziness, tremors, seizures, weakness, light-headedness, numbness and headaches.  Hematological: Negative for adenopathy. Does not bruise/bleed easily.  Psychiatric/Behavioral: Positive for decreased concentration, dysphoric mood and sleep disturbance. Negative for agitation, confusion, hallucinations, self-injury and suicidal ideas. The patient is not nervous/anxious and is not hyperactive.      Physical Exam Triage Vital Signs ED Triage Vitals  Enc Vitals Group     BP 11/24/17 1239 (!) 147/99     Pulse Rate 11/24/17 1239 (!) 107     Resp 11/24/17 1239 16     Temp 11/24/17 1239 98.1 F (36.7 C)     Temp Source 11/24/17 1239 Oral     SpO2 11/24/17 1239 100 %     Weight 11/24/17 1239 253 lb 6.4 oz (114.9 kg)     Height 11/24/17 1239 6\' 1"  (1.854 m)     Head Circumference --      Peak Flow --      Pain Score 11/24/17 1238 0     Pain Loc --      Pain Edu? --      Excl. in GC? --    No data found.  Updated Vital Signs BP (!) 147/99 (BP Location: Left Arm)   Pulse (!) 107   Temp 98.1 F (36.7 C) (Oral)   Resp 16   Ht 6\' 1"  (1.854 m)   Wt 253 lb 6.4 oz (114.9 kg)   SpO2 100%   BMI 33.43 kg/m   Visual Acuity Right Eye Distance:   Left Eye Distance:   Bilateral Distance:    Right Eye Near:   Left Eye Near:    Bilateral Near:     Physical Exam  Constitutional: He is oriented to person, place, and time. He appears well-developed and well-nourished. He is cooperative. No distress.  Patient sitting on exam table in no acute distress.   HENT:    Head: Normocephalic and atraumatic.  Right Ear: Hearing and external ear normal.  Left Ear: Hearing and external ear normal.  Nose: Nose normal.  Mouth/Throat: Uvula is midline, oropharynx is clear and moist and mucous membranes are normal. Abnormal dentition.  Eyes: Conjunctivae and EOM are normal.  Neck: Normal range of motion. Neck supple.  Cardiovascular: Normal rate, regular rhythm, normal heart sounds and normal pulses.  No murmur heard. Pulmonary/Chest: Effort normal and breath sounds normal. No respiratory distress. He has no decreased breath  sounds. He has no wheezes. He has no rhonchi. He has no rales.  Musculoskeletal: Normal range of motion.  Neurological: He is alert and oriented to person, place, and time. He has normal strength and normal reflexes. No cranial nerve deficit or sensory deficit. He displays a negative Romberg sign.  Skin: Skin is warm and dry. No rash noted.  Psychiatric: His speech is normal. Judgment and thought content normal. He is slowed. Cognition and memory are normal. He exhibits a depressed mood. He expresses no homicidal and no suicidal ideation. He expresses no suicidal plans and no homicidal plans.  Vitals reviewed.    UC Treatments / Results  Labs (all labs ordered are listed, but only abnormal results are displayed) Labs Reviewed - No data to display  EKG None  Radiology No results found.  Procedures Procedures (including critical care time)  Medications Ordered in UC Medications - No data to display  Initial Impression / Assessment and Plan / UC Course  I have reviewed the triage vital signs and the nursing notes.  Pertinent labs & imaging results that were available during my care of the patient were reviewed by me and considered in my medical decision making (see chart for details).    Discussed with patient that he appears to have a situational major depression episode. Discussed various treatment options. Reviewed that  psychotherapy is still beneficial and encouraged to continue therapy. Will trial Wellbutrin 150mg  once daily for 2 weeks. Reviewed benefits, risks and side effects. Discussed that this may help decrease smoking as well. Reviewed that optimal treatment response may take 4 to 6 weeks. Continue to monitor mood. Encouraged to decrease caffeine and [employer] drinks. Reviewed that blood pressure slightly elevated which may be related to caffeine use and smoking. Continue to monitor. Recommend call [facility] for appointment to become an established patient. Recommend follow-up with a PCP in 2 weeks for recheck. If unable to see a PCP in 2 weeks, may return here for recheck.  Final Clinical Impressions(s) / UC Diagnoses   Final diagnoses:  Reactive depression (situational)  Cigarette nicotine dependence without complication     Discharge Instructions     Recommend start Wellbutrin (Buproprion) 150mg  once daily. Continue to monitor mood. Recommend call [facility] for appointment to become established with their practice (PCP). Recommend follow-up with a PCP in 2 weeks or here if unable to see a PCP yet for recheck.     ED Prescriptions    Medication Sig Dispense Auth. Provider   buPROPion (WELLBUTRIN XL) 150 MG 24 hr tablet Take 1 tablet (150 mg total) by mouth daily. 30 tablet Sudie Grumbling, NP     Controlled Substance Prescriptions [place] Controlled Substance Registry consulted? Not Applicable   Sudie Grumbling, NP 11/25/17 [phone]

## 2017-11-24 NOTE — Discharge Instructions (Addendum)
Recommend start Wellbutrin (Buproprion) 150mg  once daily. Continue to monitor mood. [facility] to become [facility] with their practice (PCP). Recommend follow-up with a PCP in 2 weeks or here if unable to see a PCP yet for recheck.

## 2017-11-24 NOTE — ED Triage Notes (Signed)
Patient in today stating that he is not doing well. He is under a lot of stress, not sleeping, not eating. Patient has gone through a recent separation from his wife and son. He has lost his custodial rights to his son.

## 2019-03-10 ENCOUNTER — Encounter: Payer: Self-pay | Admitting: Emergency Medicine

## 2019-03-10 ENCOUNTER — Other Ambulatory Visit: Payer: Self-pay

## 2019-03-10 ENCOUNTER — Emergency Department: Payer: Self-pay

## 2019-03-10 DIAGNOSIS — Z79899 Other long term (current) drug therapy: Secondary | ICD-10-CM | POA: Insufficient documentation

## 2019-03-10 DIAGNOSIS — Y999 Unspecified external cause status: Secondary | ICD-10-CM | POA: Diagnosis not present

## 2019-03-10 DIAGNOSIS — F17210 Nicotine dependence, cigarettes, uncomplicated: Secondary | ICD-10-CM | POA: Diagnosis not present

## 2019-03-10 DIAGNOSIS — S60211A Contusion of right wrist, initial encounter: Secondary | ICD-10-CM | POA: Insufficient documentation

## 2019-03-10 DIAGNOSIS — Y939 Activity, unspecified: Secondary | ICD-10-CM | POA: Diagnosis not present

## 2019-03-10 DIAGNOSIS — S6991XA Unspecified injury of right wrist, hand and finger(s), initial encounter: Secondary | ICD-10-CM | POA: Diagnosis present

## 2019-03-10 DIAGNOSIS — I10 Essential (primary) hypertension: Secondary | ICD-10-CM | POA: Insufficient documentation

## 2019-03-10 DIAGNOSIS — Y929 Unspecified place or not applicable: Secondary | ICD-10-CM | POA: Insufficient documentation
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
## 2019-03-11 ENCOUNTER — Emergency Department
Admission: EM | Admit: 2019-03-11 | Discharge: 2019-03-11 | Disposition: A | Discharge status: Home / Self Care | Payer: Self-pay | Attending: Emergency Medicine | Admitting: Emergency Medicine

## 2019-03-11 DIAGNOSIS — S60211A Contusion of right wrist, initial encounter: Secondary | ICD-10-CM
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
## 2019-03-11 NOTE — Discharge Instructions (Signed)
